# Patient Record
Sex: Male | Born: 1950 | Race: White | Hispanic: No | Marital: Married
Health system: Southern US, Community
[De-identification: ages and names within clinical notes are randomized; demographics above are authoritative.]

## PROBLEM LIST (undated history)

## (undated) DIAGNOSIS — J9621 Acute and chronic respiratory failure with hypoxia: Secondary | ICD-10-CM

## (undated) DIAGNOSIS — G7281 Critical illness myopathy: Secondary | ICD-10-CM

## (undated) DIAGNOSIS — A419 Sepsis, unspecified organism: Secondary | ICD-10-CM

## (undated) DIAGNOSIS — U071 COVID-19: Secondary | ICD-10-CM

## (undated) DIAGNOSIS — J80 Acute respiratory distress syndrome: Secondary | ICD-10-CM

## (undated) DIAGNOSIS — R6520 Severe sepsis without septic shock: Secondary | ICD-10-CM

## (undated) DIAGNOSIS — J1282 Pneumonia due to coronavirus disease 2019: Secondary | ICD-10-CM

---

## 2019-09-13 ENCOUNTER — Inpatient Hospital Stay
Admit: 2019-09-13 | Discharge: 2019-10-11 | Disposition: A | Discharge status: Skilled Nursing Facility | Payer: Medicare Other | Source: Other Acute Inpatient Hospital | Attending: Internal Medicine | Admitting: Internal Medicine

## 2019-09-13 ENCOUNTER — Other Ambulatory Visit (HOSPITAL_COMMUNITY): Payer: Medicare Other

## 2019-09-13 ENCOUNTER — Institutional Professional Consult (permissible substitution) (HOSPITAL_COMMUNITY): Payer: Medicare Other

## 2019-09-13 DIAGNOSIS — Z930 Tracheostomy status: Secondary | ICD-10-CM

## 2019-09-13 DIAGNOSIS — G7281 Critical illness myopathy: Secondary | ICD-10-CM | POA: Diagnosis present

## 2019-09-13 DIAGNOSIS — U071 COVID-19: Secondary | ICD-10-CM | POA: Diagnosis present

## 2019-09-13 DIAGNOSIS — J9621 Acute and chronic respiratory failure with hypoxia: Secondary | ICD-10-CM | POA: Diagnosis present

## 2019-09-13 DIAGNOSIS — A419 Sepsis, unspecified organism: Secondary | ICD-10-CM | POA: Diagnosis present

## 2019-09-13 DIAGNOSIS — Z4659 Encounter for fitting and adjustment of other gastrointestinal appliance and device: Secondary | ICD-10-CM

## 2019-09-13 DIAGNOSIS — J189 Pneumonia, unspecified organism: Secondary | ICD-10-CM

## 2019-09-13 DIAGNOSIS — Z0189 Encounter for other specified special examinations: Secondary | ICD-10-CM

## 2019-09-13 DIAGNOSIS — J80 Acute respiratory distress syndrome: Secondary | ICD-10-CM | POA: Diagnosis present

## 2019-09-13 DIAGNOSIS — J9690 Respiratory failure, unspecified, unspecified whether with hypoxia or hypercapnia: Secondary | ICD-10-CM

## 2019-09-13 HISTORY — DX: Sepsis, unspecified organism: R65.20

## 2019-09-13 HISTORY — DX: Sepsis, unspecified organism: A41.9

## 2019-09-13 HISTORY — DX: COVID-19: U07.1

## 2019-09-13 HISTORY — DX: Acute and chronic respiratory failure with hypoxia: J96.21

## 2019-09-13 HISTORY — DX: Critical illness myopathy: G72.81

## 2019-09-13 HISTORY — DX: Acute respiratory distress syndrome: J80

## 2019-09-13 HISTORY — DX: Pneumonia due to coronavirus disease 2019: J12.82

## 2019-09-13 LAB — BLOOD GAS, ARTERIAL
Acid-Base Excess: 2.6 mmol/L — ABNORMAL HIGH (ref 0.0–2.0)
Bicarbonate: 26.2 mmol/L (ref 20.0–28.0)
FIO2: 50
O2 Saturation: 98.8 %
Patient temperature: 37.3
pCO2 arterial: 38.2 mmHg (ref 32.0–48.0)
pH, Arterial: 7.453 — ABNORMAL HIGH (ref 7.350–7.450)
pO2, Arterial: 130 mmHg — ABNORMAL HIGH (ref 83.0–108.0)

## 2019-09-14 ENCOUNTER — Encounter: Payer: Self-pay | Admitting: Internal Medicine

## 2019-09-14 DIAGNOSIS — J9621 Acute and chronic respiratory failure with hypoxia: Secondary | ICD-10-CM | POA: Diagnosis not present

## 2019-09-14 DIAGNOSIS — A419 Sepsis, unspecified organism: Secondary | ICD-10-CM | POA: Diagnosis not present

## 2019-09-14 DIAGNOSIS — Z930 Tracheostomy status: Secondary | ICD-10-CM

## 2019-09-14 DIAGNOSIS — U071 COVID-19: Secondary | ICD-10-CM | POA: Diagnosis not present

## 2019-09-14 DIAGNOSIS — J80 Acute respiratory distress syndrome: Secondary | ICD-10-CM

## 2019-09-14 DIAGNOSIS — R6520 Severe sepsis without septic shock: Secondary | ICD-10-CM | POA: Diagnosis present

## 2019-09-14 DIAGNOSIS — J1282 Pneumonia due to coronavirus disease 2019: Secondary | ICD-10-CM

## 2019-09-14 DIAGNOSIS — G7281 Critical illness myopathy: Secondary | ICD-10-CM

## 2019-09-14 LAB — COMPREHENSIVE METABOLIC PANEL
ALT: 16 U/L (ref 0–44)
AST: 14 U/L — ABNORMAL LOW (ref 15–41)
Albumin: 2.4 g/dL — ABNORMAL LOW (ref 3.5–5.0)
Alkaline Phosphatase: 59 U/L (ref 38–126)
Anion gap: 8 (ref 5–15)
BUN: 19 mg/dL (ref 8–23)
CO2: 25 mmol/L (ref 22–32)
Calcium: 8.8 mg/dL — ABNORMAL LOW (ref 8.9–10.3)
Chloride: 105 mmol/L (ref 98–111)
Creatinine, Ser: <0.3 mg/dL — ABNORMAL LOW (ref 0.61–1.24)
Glucose, Bld: 163 mg/dL — ABNORMAL HIGH (ref 70–99)
Potassium: 4.4 mmol/L (ref 3.5–5.1)
Sodium: 138 mmol/L (ref 135–145)
Total Bilirubin: 0.6 mg/dL (ref 0.3–1.2)
Total Protein: 6.3 g/dL — ABNORMAL LOW (ref 6.5–8.1)

## 2019-09-14 LAB — CBC WITH DIFFERENTIAL/PLATELET
Abs Immature Granulocytes: 0.25 10*3/uL — ABNORMAL HIGH (ref 0.00–0.07)
Basophils Absolute: 0.1 10*3/uL (ref 0.0–0.1)
Basophils Relative: 1 %
Eosinophils Absolute: 0.3 10*3/uL (ref 0.0–0.5)
Eosinophils Relative: 4 %
HCT: 36 % — ABNORMAL LOW (ref 39.0–52.0)
Hemoglobin: 11.7 g/dL — ABNORMAL LOW (ref 13.0–17.0)
Immature Granulocytes: 3 %
Lymphocytes Relative: 18 %
Lymphs Abs: 1.5 10*3/uL (ref 0.7–4.0)
MCH: 29.5 pg (ref 26.0–34.0)
MCHC: 32.5 g/dL (ref 30.0–36.0)
MCV: 90.9 fL (ref 80.0–100.0)
Monocytes Absolute: 1.1 10*3/uL — ABNORMAL HIGH (ref 0.1–1.0)
Monocytes Relative: 13 %
Neutro Abs: 5 10*3/uL (ref 1.7–7.7)
Neutrophils Relative %: 61 %
Platelets: 297 10*3/uL (ref 150–400)
RBC: 3.96 MIL/uL — ABNORMAL LOW (ref 4.22–5.81)
RDW: 14.3 % (ref 11.5–15.5)
WBC: 8.2 10*3/uL (ref 4.0–10.5)
nRBC: 0 % (ref 0.0–0.2)

## 2019-09-14 LAB — HEMOGLOBIN A1C
Hgb A1c MFr Bld: 8.8 % — ABNORMAL HIGH (ref 4.8–5.6)
Mean Plasma Glucose: 205.86 mg/dL

## 2019-09-14 MED ORDER — POTASSIUM CHLORIDE 20 MEQ/15ML (10%) PO SOLN
20.00 | ORAL | Status: DC
Start: 2019-09-14 — End: 2019-09-14

## 2019-09-14 MED ORDER — ATORVASTATIN CALCIUM 40 MG PO TABS
80.00 | ORAL_TABLET | ORAL | Status: DC
Start: 2019-09-13 — End: 2019-09-14

## 2019-09-14 MED ORDER — FENOFIBRATE 54 MG PO TABS
145.00 | ORAL_TABLET | ORAL | Status: DC
Start: 2019-09-14 — End: 2019-09-14

## 2019-09-14 MED ORDER — ASPIRIN 81 MG PO CHEW
81.00 | CHEWABLE_TABLET | ORAL | Status: DC
Start: 2019-09-14 — End: 2019-09-14

## 2019-09-14 MED ORDER — ENOXAPARIN SODIUM 60 MG/0.6ML ~~LOC~~ SOLN
0.50 | SUBCUTANEOUS | Status: DC
Start: 2019-09-13 — End: 2019-09-14

## 2019-09-14 MED ORDER — IPRATROPIUM-ALBUTEROL 0.5-2.5 (3) MG/3ML IN SOLN
3.00 | RESPIRATORY_TRACT | Status: DC
Start: ? — End: 2019-09-14

## 2019-09-14 MED ORDER — FAMOTIDINE 40 MG/5ML PO SUSR
20.00 | ORAL | Status: DC
Start: 2019-09-13 — End: 2019-09-14

## 2019-09-14 MED ORDER — PRO-STAT PO LIQD
ORAL | Status: DC
Start: 2019-09-13 — End: 2019-09-14

## 2019-09-14 MED ORDER — OXYCODONE HCL 5 MG/5ML PO SOLN
5.00 | ORAL | Status: DC
Start: ? — End: 2019-09-14

## 2019-09-14 MED ORDER — CHLORHEXIDINE GLUCONATE 0.12 % MT SOLN
15.00 | OROMUCOSAL | Status: DC
Start: 2019-09-13 — End: 2019-09-14

## 2019-09-14 MED ORDER — DEXTROSE 10 % IV SOLN
125.00 | INTRAVENOUS | Status: DC
Start: ? — End: 2019-09-14

## 2019-09-14 MED ORDER — THIAMINE HCL 100 MG PO TABS
100.00 | ORAL_TABLET | ORAL | Status: DC
Start: 2019-09-14 — End: 2019-09-14

## 2019-09-14 MED ORDER — GENERIC EXTERNAL MEDICATION
5.00 | Status: DC
Start: ? — End: 2019-09-14

## 2019-09-14 MED ORDER — TRAMADOL HCL 50 MG PO TABS
50.00 | ORAL_TABLET | ORAL | Status: DC
Start: ? — End: 2019-09-14

## 2019-09-14 MED ORDER — INSULIN GLARGINE 100 UNIT/ML SOLOSTAR PEN
30.00 | PEN_INJECTOR | SUBCUTANEOUS | Status: DC
Start: 2019-09-13 — End: 2019-09-14

## 2019-09-14 MED ORDER — GENERIC EXTERNAL MEDICATION
Status: DC
Start: ? — End: 2019-09-14

## 2019-09-14 MED ORDER — CHLORHEXIDINE GLUCONATE 0.12 % MT SOLN
15.00 | OROMUCOSAL | Status: DC
Start: ? — End: 2019-09-14

## 2019-09-14 MED ORDER — RISPERIDONE 0.5 MG PO TBDP
0.50 | ORAL_TABLET | ORAL | Status: DC
Start: 2019-09-13 — End: 2019-09-14

## 2019-09-14 MED ORDER — AMLODIPINE BESYLATE 5 MG PO TABS
5.00 | ORAL_TABLET | ORAL | Status: DC
Start: 2019-09-14 — End: 2019-09-14

## 2019-09-14 MED ORDER — PRAMIPEXOLE DIHYDROCHLORIDE 0.25 MG PO TABS
0.25 | ORAL_TABLET | ORAL | Status: DC
Start: 2019-09-14 — End: 2019-09-14

## 2019-09-14 MED ORDER — HYDRALAZINE HCL 20 MG/ML IJ SOLN
10.00 | INTRAMUSCULAR | Status: DC
Start: ? — End: 2019-09-14

## 2019-09-14 MED ORDER — INSULIN LISPRO 100 UNIT/ML ~~LOC~~ SOLN
2.00 | SUBCUTANEOUS | Status: DC
Start: 2019-09-13 — End: 2019-09-14

## 2019-09-14 MED ORDER — LABETALOL HCL 5 MG/ML IV SOLN
20.00 | INTRAVENOUS | Status: DC
Start: ? — End: 2019-09-14

## 2019-09-14 NOTE — Consult Note (Signed)
Pulmonary Critical Care Medicine [facility] [place]  PULMONARY SERVICE  Date of Service: 09/14/2019  PULMONARY CRITICAL CARE CONSULT   Chad Nash.  BMS:[id number]  DOB: 27-Jun-1951   DOA: 09/13/2019  Referring Physician: Carron Curie, MD  HPI: Chad Nash. is a 69 y.o. male seen for follow up of Acute on Chronic Respiratory Failure.  Patient has multiple medical problems including history of GERD esophageal reflux prostate cancer hyperlipidemia obesity restless leg type 2 diabetes hypertension presented to the hospital came into the hospital because of shortness of breath which was worsening.  Along with this the patient had cough and malaise aches and pains.  Patient did have a x-ray work-up done which showed patchy airspace disease ARDS type picture consistent with COVID-19.  Patient did undergo testing for COVID-19 which was positive had received remdesivir convalescent plasma azithromycin and Rocephin and also Decadron.  Subsequently patient was not able to do much in the way of weaning he did do spontaneous breathing trials on the ventilator after he had been intubated subsequently ended up having to have a tracheostomy as well as a PEG done for his failure to wean.  Transferred here for further management  Review of Systems:  ROS performed and is unremarkable other than noted above.  Past Medical History: Past Medical History:  Diagnosis Date  . Essential hypertension  . GERD (gastroesophageal reflux disease)  . History of prostate cancer  . HLD (hyperlipidemia)  . Obesity  . Osteoarthritis of multiple joints  . Restless leg syndrome  . Type 2 diabetes mellitus (HCC)   Social History: Social History   Socioeconomic History  . Marital status: Married  Spouse name: Not on file  . Number of children: Not on file  . Years of education: Not on file  . Highest education level: Not on file  Occupational History  . Not on file  Social Needs  .  Financial resource strain: Not on file  . Food insecurity  Worry: Not on file  Inability: Not on file  . Transportation needs  Medical: Not on file  Non-medical: Not on file  Tobacco Use  . Smoking status: Former Smoker  Years: 40.00  Types: Cigarettes  Substance and Sexual Activity  . Alcohol use: Never  Frequency: Never  Binge frequency: Never  . Drug use: Never  . Sexual activity: Not on file  Lifestyle  . Physical activity  Days per week: Not on file  Minutes per session: Not on file  . Stress: Not on file  Relationships  . Social Multimedia programmer on phone: Not on file  Gets together: Not on file  Attends religious service: Not on file  Active member of club or organization: Not on file  Attends meetings of clubs or organizations: Not on file  Relationship status: Not on file  Other Topics Concern  . Not on file  Social History Narrative  . Not on file   Family History: History reviewed. No pertinent family history.    Medications: Reviewed on Rounds  Physical Exam:  Vitals: Temperature 97.7 pulse 97 respiratory rate 30 blood pressure is 141/84 saturations 95%  Ventilator Settings mode ventilation assist control FiO2 55% tidal volume 545 PEEP 5  . General: Comfortable at this time . Eyes: Grossly normal lids, irises & conjunctiva . ENT: grossly tongue is normal . Neck: no obvious mass . Cardiovascular: S1-S2 normal no gallop or rub . Respiratory: No rhonchi no rales are noted at this time .  Abdomen: Soft and nontender . Skin: no rash seen on limited exam . Musculoskeletal: not rigid . Psychiatric:unable to assess . Neurologic: no seizure no involuntary movements         Labs on Admission:  Basic Metabolic Panel: Recent Labs  Lab 09/14/19 0554  NA 138  K 4.4  CL 105  CO2 25  GLUCOSE 163*  BUN 19  CREATININE <0.30*  CALCIUM 8.8*    Recent Labs  Lab 09/13/19 1730  PHART 7.453*  PCO2ART 38.2  PO2ART 130*  HCO3 26.2  O2SAT 98.8     Liver Function Tests: Recent Labs  Lab 09/14/19 0554  AST 14*  ALT 16  ALKPHOS 59  BILITOT 0.6  PROT 6.3*  ALBUMIN 2.4*   No results for input(s): LIPASE, AMYLASE in the last 168 hours. No results for input(s): AMMONIA in the last 168 hours.  CBC: Recent Labs  Lab 09/14/19 0554  WBC 8.2  NEUTROABS 5.0  HGB 11.7*  HCT 36.0*  MCV 90.9  PLT 297    Cardiac Enzymes: No results for input(s): CKTOTAL, CKMB, CKMBINDEX, TROPONINI in the last 168 hours.  BNP (last 3 results) No results for input(s): BNP in the last 8760 hours.  ProBNP (last 3 results) No results for input(s): PROBNP in the last 8760 hours.   Radiological Exams on Admission: DG Abd 1 View  Result Date: 09/13/2019 CLINICAL DATA:  NG tube placement EXAM: ABDOMEN - 1 VIEW COMPARISON:  None. FINDINGS: The enteric tube projects over the gastric antrum/proximal duodenum. The bowel gas pattern is nonobstructive and nonspecific. Degenerative changes are noted in the lumbar spine. IMPRESSION: Enteric tube as above. Electronically Signed   By: Katherine Mantle M.D.   On: 09/13/2019 21:24   DG Chest Port 1 View  Result Date: 09/13/2019 CLINICAL DATA:  S/P tracheostomy. Recent COVID-19. EXAM: PORTABLE CHEST 1 VIEW COMPARISON:  None. FINDINGS: Tracheostomy in place. Orogastric tube courses below the diaphragm with tip out of field of view. Normal mediastinal contours. Heart size appears normal. There are coarse reticular opacities with a peripheral and basilar predominance, greatest in the left lung base. No pneumothorax or large pleural effusion. 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 Electronically Signed   By: Emmaline Kluver M.D.   On: 09/13/2019 18:51    Assessment/Plan Active Problems:   Acute on chronic  respiratory failure with hypoxia (HCC)   COVID-19 virus infection   Pneumonia due to COVID-19 virus   Acute respiratory distress syndrome (ARDS) due to 2019 novel coronavirus (HCC)   Severe sepsis (HCC)   Critical illness myopathy   1. Acute on chronic respiratory failure with hypoxia currently patient is on full support has been on assist control mode requiring 55% FiO2 with a PEEP of 5 patient's tidal volumes are about 545 plan is to continue with full support on the ventilator.  Chest x-ray still showing some reticular opacities suggestive of some chronic interstitial lung disease.  We will need to review old films. 2. COVID-19 pneumonia patient has been treated still has significant deficits noted on the chest x-ray which is suggestive of chronic interstitial lung disease need to compare previous films likely as a consequence of COVID-19. 3. COVID-19 virus infection now in resolution phase patient slow to recover we will continue with supportive care. 4. Severe sepsis patient has been treated with antibiotics right now hemodynamics  are stable we will continue to monitor. 5. Critical illness polymyopathy physical therapy as tolerated patient is slow to recover we will continue to monitor closely.  I have personally seen and evaluated the patient, evaluated laboratory and imaging results, formulated the assessment and plan and placed orders. The Patient requires high complexity decision making for assessment and support.  Case was discussed on Rounds with the Respiratory Therapy Staff Time Spent 70minutes  Allyne Gee, MD [facility] Pulmonary Critical Care Medicine Sleep Medicine

## 2019-09-15 DIAGNOSIS — J9621 Acute and chronic respiratory failure with hypoxia: Secondary | ICD-10-CM | POA: Diagnosis not present

## 2019-09-15 DIAGNOSIS — G7281 Critical illness myopathy: Secondary | ICD-10-CM | POA: Diagnosis not present

## 2019-09-15 DIAGNOSIS — A419 Sepsis, unspecified organism: Secondary | ICD-10-CM | POA: Diagnosis not present

## 2019-09-15 DIAGNOSIS — U071 COVID-19: Secondary | ICD-10-CM | POA: Diagnosis not present

## 2019-09-15 NOTE — Progress Notes (Signed)
Pulmonary Critical Care Medicine [place]   PULMONARY CRITICAL CARE SERVICE  PROGRESS NOTE  Date of Service: 09/15/2019  Chad Mt.  HYW:[id number]  DOB: 02-25-1951   DOA: 09/13/2019  Referring Physician: Merton Border, MD  HPI: Chad Lennartz. is a 69 y.o. male seen for follow up of Acute on Chronic Respiratory Failure.  Patient is on full vent support again right now is requiring 45% FiO2.  Patient was weaning on pressure support earlier did about 2.5 hours on pressure support wean which is good progress for first day.  We are going to continue to follow this along  Medications: Reviewed on Rounds  Physical Exam:  Vitals: Temperature 98.4 pulse 107 respiratory rate 31 blood pressure is 135/71 saturations 93%  Ventilator Settings mode ventilation assist control FiO2 45% tidal volume 500 PEEP 5  . General: Comfortable at this time . Eyes: Grossly normal lids, irises & conjunctiva . ENT: grossly tongue is normal . Neck: no obvious mass . Cardiovascular: S1 S2 normal no gallop . Respiratory: No rhonchi coarse breath sounds are noted at this time . Abdomen: soft . Skin: no rash seen on limited exam . Musculoskeletal: not rigid . Psychiatric:unable to assess . Neurologic: no seizure no involuntary movements         Lab Data:   Basic Metabolic Panel: Recent Labs  Lab 09/14/19 0554  NA 138  K 4.4  CL 105  CO2 25  GLUCOSE 163*  BUN 19  CREATININE <0.30*  CALCIUM 8.8*    ABG: Recent Labs  Lab 09/13/19 1730  PHART 7.453*  PCO2ART 38.2  PO2ART 130*  HCO3 26.2  O2SAT 98.8    Liver Function Tests: Recent Labs  Lab 09/14/19 0554  AST 14*  ALT 16  ALKPHOS 59  BILITOT 0.6  PROT 6.3*  ALBUMIN 2.4*   No results for input(s): LIPASE, AMYLASE in the last 168 hours. No results for input(s): AMMONIA in the last 168 hours.  CBC: Recent Labs  Lab 09/14/19 0554  WBC 8.2  NEUTROABS 5.0  HGB 11.7*  HCT 36.0*  MCV 90.9   PLT 297    Cardiac Enzymes: No results for input(s): CKTOTAL, CKMB, CKMBINDEX, TROPONINI in the last 168 hours.  BNP (last 3 results) No results for input(s): BNP in the last 8760 hours.  ProBNP (last 3 results) No results for input(s): PROBNP in the last 8760 hours.  Radiological Exams: DG Abd 1 View  Result Date: 09/13/2019 CLINICAL DATA:  NG tube placement EXAM: ABDOMEN - 1 VIEW COMPARISON:  None. FINDINGS: The enteric tube projects over the gastric antrum/proximal duodenum. The bowel gas pattern is nonobstructive and nonspecific. Degenerative changes are noted in the lumbar spine. IMPRESSION: Enteric tube as above. Electronically Signed   By: Constance Holster M.D.   On: 09/13/2019 21:24   DG Chest Port 1 View  Result Date: 09/13/2019 CLINICAL DATA:  S/P tracheostomy. Recent COVID-19. EXAM: PORTABLE CHEST 1 VIEW COMPARISON:  None. FINDINGS: Tracheostomy in place. Orogastric tube courses below the diaphragm with tip out of field of view. Normal mediastinal contours. Heart size appears normal. There are coarse reticular opacities with a peripheral and basilar predominance, greatest in the left lung base. No pneumothorax or large pleural effusion. 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 Electronically Signed   By: Jac Canavan.D.  On: 09/13/2019 18:51    Assessment/Plan Active Problems:   Acute on chronic respiratory failure with hypoxia (HCC)   COVID-19 virus infection   Pneumonia due to COVID-19 virus   Acute respiratory distress syndrome (ARDS) due to 2019 novel coronavirus (HCC)   Severe sepsis (HCC)   Critical illness myopathy   1. Acute on chronic respiratory failure with hypoxia at this time patient is back on the resting mode on assist control mode did do about  2.5hours pressure support wean plan is to going to be to continue to build on that. 2. COVID-19 virus infection in recovery phase follow-up chest x-ray reveals chronic interstitial changes which is likely consequence of the COVID-19 infection 3. ARDS secondary to COVID-19 patient has residual deficits noted on the x-rays.  Plan is going to be to continue with therapy as tolerated. 4. Severe sepsis right now afebrile hemodynamics are stable we will continue with supportive care. 5. Critical illness myopathy patient still has significant weakness will need ongoing physical therapy as able to tolerate.   I have personally seen and evaluated the patient, evaluated laboratory and imaging results, formulated the assessment and plan and placed orders. The Patient requires high complexity decision making with multiple systems involvement.  Rounds were done with the Respiratory Therapy Director and Staff therapists and discussed with nursing staff also.  Time 35 minutes  Chad Pax, MD [facility] Pulmonary Critical Care Medicine Sleep Medicine

## 2019-09-16 DIAGNOSIS — A419 Sepsis, unspecified organism: Secondary | ICD-10-CM | POA: Diagnosis not present

## 2019-09-16 DIAGNOSIS — J9621 Acute and chronic respiratory failure with hypoxia: Secondary | ICD-10-CM | POA: Diagnosis not present

## 2019-09-16 DIAGNOSIS — U071 COVID-19: Secondary | ICD-10-CM | POA: Diagnosis not present

## 2019-09-16 DIAGNOSIS — G7281 Critical illness myopathy: Secondary | ICD-10-CM | POA: Diagnosis not present

## 2019-09-16 NOTE — Progress Notes (Signed)
Pulmonary Critical Care Medicine [facility] [place]   PULMONARY CRITICAL CARE SERVICE  PROGRESS NOTE  Date of Service: 09/16/2019  [name]  TMH:[id number]  DOB: 09/15/1950   DOA: 09/13/2019  Referring Physician: Carron Curie, MD  HPI: Tarun Patchell. is a 69 y.o. male seen for follow up of Acute on Chronic Respiratory Failure.  Patient was able to do about 4 and half hours of pressure support today actually tolerated it fairly well right now is back on assist control on 45% FiO2  Medications: Reviewed on Rounds  Physical Exam:  Vitals: Temperature 97.9 pulse 87 respiratory 32 blood pressure is 154/88 saturations 96%  Ventilator Settings mode ventilation assist control FiO2 45% tidal volume is 620 PEEP 5  . General: Comfortable at this time . Eyes: Grossly normal lids, irises & conjunctiva . ENT: grossly tongue is normal . Neck: no obvious mass . Cardiovascular: S1 S2 normal no gallop . Respiratory: No rhonchi no rales are noted at this time . Abdomen: soft . Skin: no rash seen on limited exam . Musculoskeletal: not rigid . Psychiatric:unable to assess . Neurologic: no seizure no involuntary movements         Lab Data:   Basic Metabolic Panel: Recent Labs  Lab 09/14/19 0554  NA 138  K 4.4  CL 105  CO2 25  GLUCOSE 163*  BUN 19  CREATININE <0.30*  CALCIUM 8.8*    ABG: Recent Labs  Lab 09/13/19 1730  PHART 7.453*  PCO2ART 38.2  PO2ART 130*  HCO3 26.2  O2SAT 98.8    Liver Function Tests: Recent Labs  Lab 09/14/19 0554  AST 14*  ALT 16  ALKPHOS 59  BILITOT 0.6  PROT 6.3*  ALBUMIN 2.4*   No results for input(s): LIPASE, AMYLASE in the last 168 hours. No results for input(s): AMMONIA in the last 168 hours.  CBC: Recent Labs  Lab 09/14/19 0554  WBC 8.2  NEUTROABS 5.0  HGB 11.7*  HCT 36.0*  MCV 90.9  PLT 297    Cardiac Enzymes: No results for input(s): CKTOTAL, CKMB, CKMBINDEX, TROPONINI in the last 168  hours.  BNP (last 3 results) No results for input(s): BNP in the last 8760 hours.  ProBNP (last 3 results) No results for input(s): PROBNP in the last 8760 hours.  Radiological Exams: No results found.  Assessment/Plan Active Problems:   Acute on chronic respiratory failure with hypoxia (HCC)   COVID-19 virus infection   Pneumonia due to COVID-19 virus   Acute respiratory distress syndrome (ARDS) due to 2019 novel coronavirus (HCC)   Severe sepsis (HCC)   Critical illness myopathy   1. Acute on chronic respiratory failure with hypoxia plan is to continue to wean on pressure support as tolerated patient was able to complete 4.5 hours as indicated 2. COVID-19 virus infection resolved 3. Pneumonia due to COVID-19 improving continue to monitor radiologically 4. ARDS patient has some residual changes of ARDS continue with supportive care 5. Severe sepsis hemodynamics are stable 6. Critical illness myopathy at baseline   I have personally seen and evaluated the patient, evaluated laboratory and imaging results, formulated the assessment and plan and placed orders. The Patient requires high complexity decision making with multiple systems involvement.  Rounds were done with the Respiratory Therapy Director and Staff therapists and discussed with nursing staff also.  [name], MD [facility] Pulmonary Critical Care Medicine Sleep Medicine

## 2019-09-17 DIAGNOSIS — G7281 Critical illness myopathy: Secondary | ICD-10-CM | POA: Diagnosis not present

## 2019-09-17 DIAGNOSIS — J9621 Acute and chronic respiratory failure with hypoxia: Secondary | ICD-10-CM | POA: Diagnosis not present

## 2019-09-17 DIAGNOSIS — U071 COVID-19: Secondary | ICD-10-CM | POA: Diagnosis not present

## 2019-09-17 DIAGNOSIS — A419 Sepsis, unspecified organism: Secondary | ICD-10-CM | POA: Diagnosis not present

## 2019-09-17 NOTE — Progress Notes (Signed)
Pulmonary Critical Care Medicine [facility] [place]   PULMONARY CRITICAL CARE SERVICE  PROGRESS NOTE  Date of Service: 09/17/2019  Chad Nash.  YHC:[id number]  DOB: 12-02-1950   DOA: 09/13/2019  Referring Physician: Carron Curie, MD  HPI: Chad Nash. is a 69 y.o. male seen for follow up of Acute on Chronic Respiratory Failure.  Patient currently is on vent on pressure support mode weaning on protocol the patient is supposed to do 8 hours on pressure support and so far is tolerating it well.  Secretions are reportedly minimal  Medications: Reviewed on Rounds  Physical Exam:  Vitals: Temperature 98.4 pulse 102 respiratory 33 blood pressure is 161/88 saturations 95%  Ventilator Settings mode ventilation pressure support 5 to 45% pressure support 12 PEEP 5 tidal volume is 622  . General: Comfortable at this time . Eyes: Grossly normal lids, irises & conjunctiva . ENT: grossly tongue is normal . Neck: no obvious mass . Cardiovascular: S1 S2 normal no gallop . Respiratory: No rhonchi no rales are noted at this time . Abdomen: soft . Skin: no rash seen on limited exam . Musculoskeletal: not rigid . Psychiatric:unable to assess . Neurologic: no seizure no involuntary movements         Lab Data:   Basic Metabolic Panel: Recent Labs  Lab 09/14/19 0554  NA 138  K 4.4  CL 105  CO2 25  GLUCOSE 163*  BUN 19  CREATININE <0.30*  CALCIUM 8.8*    ABG: Recent Labs  Lab 09/13/19 1730  PHART 7.453*  PCO2ART 38.2  PO2ART 130*  HCO3 26.2  O2SAT 98.8    Liver Function Tests: Recent Labs  Lab 09/14/19 0554  AST 14*  ALT 16  ALKPHOS 59  BILITOT 0.6  PROT 6.3*  ALBUMIN 2.4*   No results for input(s): LIPASE, AMYLASE in the last 168 hours. No results for input(s): AMMONIA in the last 168 hours.  CBC: Recent Labs  Lab 09/14/19 0554  WBC 8.2  NEUTROABS 5.0  HGB 11.7*  HCT 36.0*  MCV 90.9  PLT 297    Cardiac Enzymes: No  results for input(s): CKTOTAL, CKMB, CKMBINDEX, TROPONINI in the last 168 hours.  BNP (last 3 results) No results for input(s): BNP in the last 8760 hours.  ProBNP (last 3 results) No results for input(s): PROBNP in the last 8760 hours.  Radiological Exams: No results found.  Assessment/Plan Active Problems:   Acute on chronic respiratory failure with hypoxia (HCC)   COVID-19 virus infection   Pneumonia due to COVID-19 virus   Acute respiratory distress syndrome (ARDS) due to 2019 novel coronavirus (HCC)   Severe sepsis (HCC)   Critical illness myopathy   1. Acute on chronic respiratory failure hypoxia plan is to continue with the weaning protocol currently is on 12/5 pressure support wean and is tolerating well the goal is for 8 hours today 2. COVID-19 virus infection in resolution phase 3. Pneumonia due to COVID-19 treated resolved 4. ARDS treated we will continue with supportive care 5. Severe sepsis hemodynamics are stable 6. Critical illness myopathy continue with physical therapy supportive care at this time   I have personally seen and evaluated the patient, evaluated laboratory and imaging results, formulated the assessment and plan and placed orders. The Patient requires high complexity decision making with multiple systems involvement.  Rounds were done with the Respiratory Therapy Director and Staff therapists and discussed with nursing staff also.  [name], MD [facility] Pulmonary Critical  Care Medicine Sleep Medicine

## 2019-09-18 DIAGNOSIS — G7281 Critical illness myopathy: Secondary | ICD-10-CM | POA: Diagnosis not present

## 2019-09-18 DIAGNOSIS — U071 COVID-19: Secondary | ICD-10-CM | POA: Diagnosis not present

## 2019-09-18 DIAGNOSIS — A419 Sepsis, unspecified organism: Secondary | ICD-10-CM | POA: Diagnosis not present

## 2019-09-18 DIAGNOSIS — J9621 Acute and chronic respiratory failure with hypoxia: Secondary | ICD-10-CM | POA: Diagnosis not present

## 2019-09-18 LAB — BASIC METABOLIC PANEL
Anion gap: 11 (ref 5–15)
BUN: 22 mg/dL (ref 8–23)
CO2: 26 mmol/L (ref 22–32)
Calcium: 9.1 mg/dL (ref 8.9–10.3)
Chloride: 103 mmol/L (ref 98–111)
Creatinine, Ser: <0.3 mg/dL — ABNORMAL LOW (ref 0.61–1.24)
Glucose, Bld: 146 mg/dL — ABNORMAL HIGH (ref 70–99)
Potassium: 3.8 mmol/L (ref 3.5–5.1)
Sodium: 140 mmol/L (ref 135–145)

## 2019-09-18 LAB — CBC
HCT: 32.8 % — ABNORMAL LOW (ref 39.0–52.0)
Hemoglobin: 10.8 g/dL — ABNORMAL LOW (ref 13.0–17.0)
MCH: 29.8 pg (ref 26.0–34.0)
MCHC: 32.9 g/dL (ref 30.0–36.0)
MCV: 90.6 fL (ref 80.0–100.0)
Platelets: 396 10*3/uL (ref 150–400)
RBC: 3.62 MIL/uL — ABNORMAL LOW (ref 4.22–5.81)
RDW: 14.2 % (ref 11.5–15.5)
WBC: 12.3 10*3/uL — ABNORMAL HIGH (ref 4.0–10.5)
nRBC: 0 % (ref 0.0–0.2)

## 2019-09-18 NOTE — Progress Notes (Signed)
Pulmonary Critical Care Medicine [facility] [place]   PULMONARY CRITICAL CARE SERVICE  PROGRESS NOTE  Date of Service: 09/18/2019  Chad Nash.  AST:[id number]  DOB: June 09, 1951   DOA: 09/13/2019  Referring Physician: Carron Curie, MD  HPI: Chad Nash. is a 69 y.o. male seen for follow up of Acute on Chronic Respiratory Failure.  Patient currently is on pressure support wean seems to be tolerating it well the goal is for about 12 hours today which is coming along nicely so far  Medications: Reviewed on Rounds  Physical Exam:  Vitals: Temperature 98.1 pulse 95 respiratory rate 30 blood pressure is 173/71 saturations 95%  Ventilator Settings mode of ventilation is pressure support FiO2 45% pressure 12 PEEP 5  . General: Comfortable at this time . Eyes: Grossly normal lids, irises & conjunctiva . ENT: grossly tongue is normal . Neck: no obvious mass . Cardiovascular: S1 S2 normal no gallop . Respiratory: No rhonchi no rales are noted at this time . Abdomen: soft . Skin: no rash seen on limited exam . Musculoskeletal: not rigid . Psychiatric:unable to assess . Neurologic: no seizure no involuntary movements         Lab Data:   Basic Metabolic Panel: Recent Labs  Lab 09/14/19 0554 09/18/19 0600  NA 138 140  K 4.4 3.8  CL 105 103  CO2 25 26  GLUCOSE 163* 146*  BUN 19 22  CREATININE <0.30* <0.30*  CALCIUM 8.8* 9.1    ABG: Recent Labs  Lab 09/13/19 1730  PHART 7.453*  PCO2ART 38.2  PO2ART 130*  HCO3 26.2  O2SAT 98.8    Liver Function Tests: Recent Labs  Lab 09/14/19 0554  AST 14*  ALT 16  ALKPHOS 59  BILITOT 0.6  PROT 6.3*  ALBUMIN 2.4*   No results for input(s): LIPASE, AMYLASE in the last 168 hours. No results for input(s): AMMONIA in the last 168 hours.  CBC: Recent Labs  Lab 09/14/19 0554 09/18/19 0600  WBC 8.2 12.3*  NEUTROABS 5.0  --   HGB 11.7* 10.8*  HCT 36.0* 32.8*  MCV 90.9 90.6  PLT 297 396     Cardiac Enzymes: No results for input(s): CKTOTAL, CKMB, CKMBINDEX, TROPONINI in the last 168 hours.  BNP (last 3 results) No results for input(s): BNP in the last 8760 hours.  ProBNP (last 3 results) No results for input(s): PROBNP in the last 8760 hours.  Radiological Exams: No results found.  Assessment/Plan Active Problems:   Acute on chronic respiratory failure with hypoxia (HCC)   COVID-19 virus infection   Pneumonia due to COVID-19 virus   Acute respiratory distress syndrome (ARDS) due to 2019 novel coronavirus (HCC)   Severe sepsis (HCC)   Critical illness myopathy   1. Acute on chronic respiratory failure with hypoxia we will continue with weaning on pressure support goal 12/5 for 12 hours 2. COVID-19 virus infection treated clinically is improving we will continue with supportive care 3. Pneumonia due to COVID-19 treated clinically improved 4. ARDS slow to resolve secondary to COVID-19 will continue to follow 5. Severe sepsis resolved hemodynamics are stable 6. Critical illness myopathy physical therapy as tolerated we will continue to monitor closely.   I have personally seen and evaluated the patient, evaluated laboratory and imaging results, formulated the assessment and plan and placed orders. The Patient requires high complexity decision making with multiple systems involvement.  Rounds were done with the Respiratory Therapy Director and Staff therapists and discussed with  nursing staff also.  Allyne Gee, MD [facility] Pulmonary Critical Care Medicine Sleep Medicine

## 2019-09-19 DIAGNOSIS — J9621 Acute and chronic respiratory failure with hypoxia: Secondary | ICD-10-CM | POA: Diagnosis not present

## 2019-09-19 DIAGNOSIS — U071 COVID-19: Secondary | ICD-10-CM | POA: Diagnosis not present

## 2019-09-19 DIAGNOSIS — G7281 Critical illness myopathy: Secondary | ICD-10-CM | POA: Diagnosis not present

## 2019-09-19 DIAGNOSIS — A419 Sepsis, unspecified organism: Secondary | ICD-10-CM | POA: Diagnosis not present

## 2019-09-19 NOTE — Progress Notes (Signed)
Pulmonary Critical Care Medicine [facility] [place]   PULMONARY CRITICAL CARE SERVICE  PROGRESS NOTE  Date of Service: 09/19/2019  [name]  CVE:[id number]  DOB: [birth year]   DOA: 09/13/2019  Referring Physician: Carron Curie, MD  HPI: Zaydrian Batta. is a 69 y.o. male seen for follow up of Acute on Chronic Respiratory Failure.  The patient this morning apparently had increased respiratory rate noted.  Patient has been on 40% FiO2 respiratory rate had gone up to 40 was given some Ativan and actually the respiratory rate has now come down to about 28.  Patient saturations are good the respiratory therapist is going to assess the potential for resuming weaning on the patient later this morning  Medications: Reviewed on Rounds  Physical Exam:  Vitals: Temperature 96.3 pulse 109 respiratory rate 40 blood pressure 148/73 saturations 95%  Ventilator Settings mode ventilation assist control FiO2 40% tidal line 375 PEEP 5  . General: Comfortable at this time . Eyes: Grossly normal lids, irises & conjunctiva . ENT: grossly tongue is normal . Neck: no obvious mass . Cardiovascular: S1 S2 normal no gallop . Respiratory: No rhonchi coarse breath sounds are noted at this time . Abdomen: soft . Skin: no rash seen on limited exam . Musculoskeletal: not rigid . Psychiatric:unable to assess . Neurologic: no seizure no involuntary movements         Lab Data:   Basic Metabolic Panel: Recent Labs  Lab 09/14/19 0554 09/18/19 0600  NA 138 140  K 4.4 3.8  CL 105 103  CO2 25 26  GLUCOSE 163* 146*  BUN 19 22  CREATININE <0.30* <0.30*  CALCIUM 8.8* 9.1    ABG: Recent Labs  Lab 09/13/19 1730  PHART 7.453*  PCO2ART 38.2  PO2ART 130*  HCO3 26.2  O2SAT 98.8    Liver Function Tests: Recent Labs  Lab 09/14/19 0554  AST 14*  ALT 16  ALKPHOS 59  BILITOT 0.6  PROT 6.3*  ALBUMIN 2.4*   No results for input(s): LIPASE, AMYLASE in the last 168  hours. No results for input(s): AMMONIA in the last 168 hours.  CBC: Recent Labs  Lab 09/14/19 0554 09/18/19 0600  WBC 8.2 12.3*  NEUTROABS 5.0  --   HGB 11.7* 10.8*  HCT 36.0* 32.8*  MCV 90.9 90.6  PLT 297 396    Cardiac Enzymes: No results for input(s): CKTOTAL, CKMB, CKMBINDEX, TROPONINI in the last 168 hours.  BNP (last 3 results) No results for input(s): BNP in the last 8760 hours.  ProBNP (last 3 results) No results for input(s): PROBNP in the last 8760 hours.  Radiological Exams: No results found.  Assessment/Plan Active Problems:   Acute on chronic respiratory failure with hypoxia (HCC)   COVID-19 virus infection   Pneumonia due to COVID-19 virus   Acute respiratory distress syndrome (ARDS) due to 2019 novel coronavirus (HCC)   Severe sepsis (HCC)   Critical illness myopathy   1. Acute on chronic respiratory failure with hypoxia plan is to continue with full support on the ventilator for now respiratory therapist will reassess again this afternoon and try to resume the weaning if patient's respiratory rate is continues to stay down. 2. COVID-19 virus infection and now resolution phase 3. Pneumonia due to COVID-19 treated we will continue to monitor 4. ARDS slowly improving secondary to COVID-19 will continue with supportive care 5. Severe sepsis hemodynamics are stable at this time 6. Critical illness myopathy physical therapy as tolerated  I have personally seen and evaluated the patient, evaluated laboratory and imaging results, formulated the assessment and plan and placed orders. The Patient requires high complexity decision making with multiple systems involvement.  Rounds were done with the Respiratory Therapy Director and Staff therapists and discussed with nursing staff also.  Time 35 minutes  Allyne Gee, MD [facility] Pulmonary Critical Care Medicine Sleep Medicine

## 2019-09-20 DIAGNOSIS — G7281 Critical illness myopathy: Secondary | ICD-10-CM | POA: Diagnosis not present

## 2019-09-20 DIAGNOSIS — J9621 Acute and chronic respiratory failure with hypoxia: Secondary | ICD-10-CM | POA: Diagnosis not present

## 2019-09-20 DIAGNOSIS — A419 Sepsis, unspecified organism: Secondary | ICD-10-CM | POA: Diagnosis not present

## 2019-09-20 DIAGNOSIS — U071 COVID-19: Secondary | ICD-10-CM | POA: Diagnosis not present

## 2019-09-20 LAB — BASIC METABOLIC PANEL
Anion gap: 11 (ref 5–15)
BUN: 27 mg/dL — ABNORMAL HIGH (ref 8–23)
CO2: 29 mmol/L (ref 22–32)
Calcium: 9.2 mg/dL (ref 8.9–10.3)
Chloride: 102 mmol/L (ref 98–111)
Creatinine, Ser: 0.4 mg/dL — ABNORMAL LOW (ref 0.61–1.24)
GFR calc Af Amer: >60 mL/min (ref >60)
GFR calc non Af Amer: >60 mL/min (ref >60)
Glucose, Bld: 121 mg/dL — ABNORMAL HIGH (ref 70–99)
Potassium: 3.5 mmol/L (ref 3.5–5.1)
Sodium: 142 mmol/L (ref 135–145)

## 2019-09-20 LAB — CBC
HCT: 34.7 % — ABNORMAL LOW (ref 39.0–52.0)
Hemoglobin: 11.2 g/dL — ABNORMAL LOW (ref 13.0–17.0)
MCH: 29.6 pg (ref 26.0–34.0)
MCHC: 32.3 g/dL (ref 30.0–36.0)
MCV: 91.8 fL (ref 80.0–100.0)
Platelets: 362 10*3/uL (ref 150–400)
RBC: 3.78 MIL/uL — ABNORMAL LOW (ref 4.22–5.81)
RDW: 14.3 % (ref 11.5–15.5)
WBC: 10 10*3/uL (ref 4.0–10.5)
nRBC: 0 % (ref 0.0–0.2)

## 2019-09-20 NOTE — Progress Notes (Signed)
Pulmonary Critical Care Medicine [facility] [place]   PULMONARY CRITICAL CARE SERVICE  PROGRESS NOTE  Date of Service: 09/20/2019  Chad Nash.  [id number]:[id number]  DOB: [birth year]   DOA: 09/13/2019  Referring Physician: Carron Curie, MD  HPI: Chad Nash. is a 69 y.o. male seen for follow up of Acute on Chronic Respiratory Failure.  Patient has been gradually weaning on pressure support mode right now is on 35% FiO2 good saturations are noted currently was on a pressure of 12/5 patient was able to complete 16 hours yesterday on pressure support so he may be a candidate to try to advance to T collar  Medications: Reviewed on Rounds  Physical Exam:  Vitals: Temperature 97.6 pulse 86 respiratory rate 30 blood pressure is 136/76 saturations 98%  Ventilator Settings mode of ventilation pressure support FiO2 35% pressure poor 12 PEEP 5  . General: Comfortable at this time . Eyes: Grossly normal lids, irises & conjunctiva . ENT: grossly tongue is normal . Neck: no obvious mass . Cardiovascular: S1 S2 normal no gallop . Respiratory: Scattered rhonchi expansion is equal at this time . Abdomen: soft . Skin: no rash seen on limited exam . Musculoskeletal: not rigid . Psychiatric:unable to assess . Neurologic: no seizure no involuntary movements         Lab Data:   Basic Metabolic Panel: Recent Labs  Lab 09/14/19 0554 09/18/19 0600 09/20/19 0534  NA 138 140 142  K 4.4 3.8 3.5  CL 105 103 102  CO2 25 26 29   GLUCOSE 163* 146* 121*  BUN 19 22 27*  CREATININE <0.30* <0.30* 0.40*  CALCIUM 8.8* 9.1 9.2    ABG: Recent Labs  Lab 09/13/19 1730  PHART 7.453*  PCO2ART 38.2  PO2ART 130*  HCO3 26.2  O2SAT 98.8    Liver Function Tests: Recent Labs  Lab 09/14/19 0554  AST 14*  ALT 16  ALKPHOS 59  BILITOT 0.6  PROT 6.3*  ALBUMIN 2.4*   No results for input(s): LIPASE, AMYLASE in the last 168 hours. No results for input(s): AMMONIA in the  last 168 hours.  CBC: Recent Labs  Lab 09/14/19 0554 09/18/19 0600 09/20/19 0534  WBC 8.2 12.3* 10.0  NEUTROABS 5.0  --   --   HGB 11.7* 10.8* 11.2*  HCT 36.0* 32.8* 34.7*  MCV 90.9 90.6 91.8  PLT 297 396 362    Cardiac Enzymes: No results for input(s): CKTOTAL, CKMB, CKMBINDEX, TROPONINI in the last 168 hours.  BNP (last 3 results) No results for input(s): BNP in the last 8760 hours.  ProBNP (last 3 results) No results for input(s): PROBNP in the last 8760 hours.  Radiological Exams: No results found.  Assessment/Plan Active Problems:   Acute on chronic respiratory failure with hypoxia (HCC)   COVID-19 virus infection   Pneumonia due to COVID-19 virus   Acute respiratory distress syndrome (ARDS) due to 2019 novel coronavirus (HCC)   Severe sepsis (HCC)   Critical illness myopathy   1. Acute on chronic respiratory failure with hypoxia plan is to continue with the wean protocol patient was able to complete 16 hours yesterday didn't really have much in the way of distress had some increase in respiratory rate however does seem to respond nicely to Ativan.  This morning patient's rate was around 30 appears to more calm so we will try to do T collar weaning according to protocol however will need to be monitored closely. 2. COVID-19 virus infection this has  resolved and is in resolution phase 3. Pneumonia due to COVID-19 still with significant residual deficits noted on the chest film. 4. ARDS slow improvement oxygen requirements are improving right now is on 35% FiO2 5. Severe sepsis hemodynamics are stable we'll continue with present management 6. Critical illness myopathy physical therapy as tolerated this may very well be the limiting factor as far as being able to wean however so far looks good   I have personally seen and evaluated the patient, evaluated laboratory and imaging results, formulated the assessment and plan and placed orders. The Patient requires high  complexity decision making with multiple systems involvement.  Time 35 minutes Rounds were done with the Respiratory Therapy Director and Staff therapists and discussed with nursing staff also.  Allyne Gee, MD [facility] Pulmonary Critical Care Medicine Sleep Medicine

## 2019-09-21 DIAGNOSIS — A419 Sepsis, unspecified organism: Secondary | ICD-10-CM | POA: Diagnosis not present

## 2019-09-21 DIAGNOSIS — U071 COVID-19: Secondary | ICD-10-CM | POA: Diagnosis not present

## 2019-09-21 DIAGNOSIS — J9621 Acute and chronic respiratory failure with hypoxia: Secondary | ICD-10-CM | POA: Diagnosis not present

## 2019-09-21 DIAGNOSIS — G7281 Critical illness myopathy: Secondary | ICD-10-CM | POA: Diagnosis not present

## 2019-09-21 LAB — BASIC METABOLIC PANEL
Anion gap: 7 (ref 5–15)
BUN: 25 mg/dL — ABNORMAL HIGH (ref 8–23)
CO2: 31 mmol/L (ref 22–32)
Calcium: 8.7 mg/dL — ABNORMAL LOW (ref 8.9–10.3)
Chloride: 102 mmol/L (ref 98–111)
Creatinine, Ser: 0.38 mg/dL — ABNORMAL LOW (ref 0.61–1.24)
GFR calc Af Amer: >60 mL/min (ref >60)
GFR calc non Af Amer: >60 mL/min (ref >60)
Glucose, Bld: 114 mg/dL — ABNORMAL HIGH (ref 70–99)
Potassium: 3.6 mmol/L (ref 3.5–5.1)
Sodium: 140 mmol/L (ref 135–145)

## 2019-09-21 LAB — CBC
HCT: 32.2 % — ABNORMAL LOW (ref 39.0–52.0)
Hemoglobin: 10.3 g/dL — ABNORMAL LOW (ref 13.0–17.0)
MCH: 29.4 pg (ref 26.0–34.0)
MCHC: 32 g/dL (ref 30.0–36.0)
MCV: 92 fL (ref 80.0–100.0)
Platelets: 326 10*3/uL (ref 150–400)
RBC: 3.5 MIL/uL — ABNORMAL LOW (ref 4.22–5.81)
RDW: 14.4 % (ref 11.5–15.5)
WBC: 7.8 10*3/uL (ref 4.0–10.5)
nRBC: 0 % (ref 0.0–0.2)

## 2019-09-21 NOTE — Progress Notes (Signed)
Pulmonary Critical Care Medicine [facility] [place]   PULMONARY CRITICAL CARE SERVICE  PROGRESS NOTE  Date of Service: 09/21/2019  Chad Nash.  QAS:[id number]  DOB: Nov 06, 1950   DOA: 09/13/2019  Referring Physician: Carron Curie, MD  HPI: Chad Nash. is a 69 y.o. male seen for follow up of Acute on Chronic Respiratory Failure.  Patient currently is on T collar has been on 35% FiO2 the goal is for 8 hours  Medications: Reviewed on Rounds  Physical Exam:  Vitals: Temperature 97.3 pulse 80 respiratory 26 blood pressure is 122/77 saturations 98%  Ventilator Settings on T collar currently on 35% FiO2 with a goal of 8 hours  . General: Comfortable at this time . Eyes: Grossly normal lids, irises & conjunctiva . ENT: grossly tongue is normal . Neck: no obvious mass . Cardiovascular: S1 S2 normal no gallop . Respiratory: No rhonchi no rales are noted at this time . Abdomen: soft . Skin: no rash seen on limited exam . Musculoskeletal: not rigid . Psychiatric:unable to assess . Neurologic: no seizure no involuntary movements         Lab Data:   Basic Metabolic Panel: Recent Labs  Lab 09/18/19 0600 09/20/19 0534 09/21/19 0545  NA 140 142 140  K 3.8 3.5 3.6  CL 103 102 102  CO2 26 29 31   GLUCOSE 146* 121* 114*  BUN 22 27* 25*  CREATININE <0.30* 0.40* 0.38*  CALCIUM 9.1 9.2 8.7*    ABG: No results for input(s): PHART, PCO2ART, PO2ART, HCO3, O2SAT in the last 168 hours.  Liver Function Tests: No results for input(s): AST, ALT, ALKPHOS, BILITOT, PROT, ALBUMIN in the last 168 hours. No results for input(s): LIPASE, AMYLASE in the last 168 hours. No results for input(s): AMMONIA in the last 168 hours.  CBC: Recent Labs  Lab 09/18/19 0600 09/20/19 0534 09/21/19 0545  WBC 12.3* 10.0 7.8  HGB 10.8* 11.2* 10.3*  HCT 32.8* 34.7* 32.2*  MCV 90.6 91.8 92.0  PLT 396 362 326    Cardiac Enzymes: No results for input(s): CKTOTAL, CKMB,  CKMBINDEX, TROPONINI in the last 168 hours.  BNP (last 3 results) No results for input(s): BNP in the last 8760 hours.  ProBNP (last 3 results) No results for input(s): PROBNP in the last 8760 hours.  Radiological Exams: No results found.  Assessment/Plan Active Problems:   Acute on chronic respiratory failure with hypoxia (HCC)   COVID-19 virus infection   Pneumonia due to COVID-19 virus   Acute respiratory distress syndrome (ARDS) due to 2019 novel coronavirus (HCC)   Severe sepsis (HCC)   Critical illness myopathy   1. Acute on chronic respiratory failure hypoxia plan continue with T collar trials 8-hour goal patient is doing well so far 2. COVID-19 virus infection treated continue with present management 3. Pneumonia due to COVID-19 continue to monitor closely slow to improve 4. ARDS treated continue with supportive care slow to improve 5. Severe sepsis resolved hemodynamics are stable 6. Critical illness myopathy slowly improving we will continue to follow   I have personally seen and evaluated the patient, evaluated laboratory and imaging results, formulated the assessment and plan and placed orders. The Patient requires high complexity decision making with multiple systems involvement.  Rounds were done with the Respiratory Therapy Director and Staff therapists and discussed with nursing staff also.  2020, MD [facility] Pulmonary Critical Care Medicine Sleep Medicine

## 2019-09-22 ENCOUNTER — Other Ambulatory Visit (HOSPITAL_COMMUNITY): Payer: Medicare Other

## 2019-09-22 DIAGNOSIS — U071 COVID-19: Secondary | ICD-10-CM | POA: Diagnosis not present

## 2019-09-22 DIAGNOSIS — G7281 Critical illness myopathy: Secondary | ICD-10-CM | POA: Diagnosis not present

## 2019-09-22 DIAGNOSIS — A419 Sepsis, unspecified organism: Secondary | ICD-10-CM | POA: Diagnosis not present

## 2019-09-22 DIAGNOSIS — J9621 Acute and chronic respiratory failure with hypoxia: Secondary | ICD-10-CM | POA: Diagnosis not present

## 2019-09-22 MED ORDER — GENERIC EXTERNAL MEDICATION
Status: DC
Start: ? — End: 2019-09-22

## 2019-09-22 NOTE — Progress Notes (Addendum)
Pulmonary Critical Care Medicine [facility] [place]   PULMONARY CRITICAL CARE SERVICE  PROGRESS NOTE  Date of Service: 09/22/2019  Chad Nash.  [id number]  DOB: [birth year]   DOA: 09/13/2019  Referring Physician: Carron Curie, MD  HPI: Chad Nash. is a 69 y.o. male seen for follow up of Acute on Chronic Respiratory Failure.  Patient remains on NAG 4 L/min for 12-hour goal today.  Medications: Reviewed on Rounds  Physical Exam:  Vitals: Pulse 87 respirations 29 BP 150/87 O2 sat 98% temp 97.8  Ventilator Settings no rales or rhonchi noted  . General: Comfortable at this time . Eyes: Grossly normal lids, irises & conjunctiva . ENT: grossly tongue is normal . Neck: no obvious mass . Cardiovascular: S1 S2 normal no gallop . Respiratory: Coarse breath sounds . Abdomen: soft . Skin: no rash seen on limited exam . Musculoskeletal: not rigid . Psychiatric:unable to assess . Neurologic: no seizure no involuntary movements         Lab Data:   Basic Metabolic Panel: Recent Labs  Lab 09/18/19 0600 09/20/19 0534 09/21/19 0545  NA 140 142 140  K 3.8 3.5 3.6  CL 103 102 102  CO2 26 29 31   GLUCOSE 146* 121* 114*  BUN 22 27* 25*  CREATININE <0.30* 0.40* 0.38*  CALCIUM 9.1 9.2 8.7*    ABG: No results for input(s): PHART, PCO2ART, PO2ART, HCO3, O2SAT in the last 168 hours.  Liver Function Tests: No results for input(s): AST, ALT, ALKPHOS, BILITOT, PROT, ALBUMIN in the last 168 hours. No results for input(s): LIPASE, AMYLASE in the last 168 hours. No results for input(s): AMMONIA in the last 168 hours.  CBC: Recent Labs  Lab 09/18/19 0600 09/20/19 0534 09/21/19 0545  WBC 12.3* 10.0 7.8  HGB 10.8* 11.2* 10.3*  HCT 32.8* 34.7* 32.2*  MCV 90.6 91.8 92.0  PLT 396 362 326    Cardiac Enzymes: No results for input(s): CKTOTAL, CKMB, CKMBINDEX, TROPONINI in the last 168 hours.  BNP (last 3 results) No results for input(s): BNP in  the last 8760 hours.  ProBNP (last 3 results) No results for input(s): PROBNP in the last 8760 hours.  Radiological Exams: DG CHEST PORT 1 VIEW  Result Date: 09/22/2019 CLINICAL DATA:  Respiratory failure EXAM: PORTABLE CHEST 1 VIEW COMPARISON:  September 13, 2019. FINDINGS: Tracheostomy catheter tip is 4.1 cm above the carina. Feeding tube tip is below the diaphragm. No pneumothorax. There is extensive coarse interstitial thickening and patchy airspace opacity throughout the lungs bilaterally, similar to prior study. No new opacity is appreciable allowing for slight differences in technique. There is mild cardiac enlargement with pulmonary vascularity normal. No adenopathy. There is aortic atherosclerosis. No evident adenopathy. No bone lesions. IMPRESSION: Multifocal interstitial and patchy airspace opacity, similar to prior study. Suspect atypical organism pneumonia. There may well be a degree of chronic interstitial disease; no earlier examinations beyond 10 days prior to current examination available. Stable cardiac silhouette. Tube positions as described without pneumothorax. Electronically Signed   By: September 15, 2019 III M.D.   On: 09/22/2019 08:52    Assessment/Plan Active Problems:   Acute on chronic respiratory failure with hypoxia (HCC)   COVID-19 virus infection   Pneumonia due to COVID-19 virus   Acute respiratory distress syndrome (ARDS) due to 2019 novel coronavirus (HCC)   Severe sepsis (HCC)   Critical illness myopathy   1. Acute on chronic respiratory failure hypoxia patient has a 12-hour goal today on 4  L NAG.  Will continue aggressive pulmonary toilet and supportive measures. 2. COVID-19 virus infection treated continue with present management 3. Pneumonia due to COVID-19 continue to monitor closely slow to improve 4. ARDS treated continue with supportive care slow to improve 5. Severe sepsis resolved hemodynamics are stable 6. Critical illness myopathy slowly improving  we will continue to follow   I have personally seen and evaluated the patient, evaluated laboratory and imaging results, formulated the assessment and plan and placed orders. The Patient requires high complexity decision making with multiple systems involvement.  Rounds were done with the Respiratory Therapy Director and Staff therapists and discussed with nursing staff also.  Allyne Gee, MD [facility] Pulmonary Critical Care Medicine Sleep Medicine

## 2019-09-23 DIAGNOSIS — G7281 Critical illness myopathy: Secondary | ICD-10-CM | POA: Diagnosis not present

## 2019-09-23 DIAGNOSIS — U071 COVID-19: Secondary | ICD-10-CM | POA: Diagnosis not present

## 2019-09-23 DIAGNOSIS — A419 Sepsis, unspecified organism: Secondary | ICD-10-CM | POA: Diagnosis not present

## 2019-09-23 DIAGNOSIS — J9621 Acute and chronic respiratory failure with hypoxia: Secondary | ICD-10-CM | POA: Diagnosis not present

## 2019-09-23 NOTE — Progress Notes (Signed)
Pulmonary Critical Care Medicine [place]   PULMONARY CRITICAL CARE SERVICE  PROGRESS NOTE  Date of Service: 09/23/2019  Chad Mt.  VQM:[id number]  DOB: 03/04/1951   DOA: 09/13/2019  Referring Physician: Merton Border, MD  HPI: Chad Bigley. is a 69 y.o. male seen for follow up of Acute on Chronic Respiratory Failure.  Patient currently is off the ventilator on NAG patient is supposed to do 16-hour goal today.  Medications: Reviewed on Rounds  Physical Exam:  Vitals: Temperature 96.8 pulse 86 respiratory rate 23 blood pressure is 145/81 saturations 98%  Ventilator Settings off the ventilator on NAG  . General: Comfortable at this time . Eyes: Grossly normal lids, irises & conjunctiva . ENT: grossly tongue is normal . Neck: no obvious mass . Cardiovascular: S1 S2 normal no gallop . Respiratory: No rhonchi coarse breath sounds . Abdomen: soft . Skin: no rash seen on limited exam . Musculoskeletal: not rigid . Psychiatric:unable to assess . Neurologic: no seizure no involuntary movements         Lab Data:   Basic Metabolic Panel: Recent Labs  Lab 09/18/19 0600 09/20/19 0534 09/21/19 0545  NA 140 142 140  K 3.8 3.5 3.6  CL 103 102 102  CO2 26 29 31   GLUCOSE 146* 121* 114*  BUN 22 27* 25*  CREATININE <0.30* 0.40* 0.38*  CALCIUM 9.1 9.2 8.7*    ABG: No results for input(s): PHART, PCO2ART, PO2ART, HCO3, O2SAT in the last 168 hours.  Liver Function Tests: No results for input(s): AST, ALT, ALKPHOS, BILITOT, PROT, ALBUMIN in the last 168 hours. No results for input(s): LIPASE, AMYLASE in the last 168 hours. No results for input(s): AMMONIA in the last 168 hours.  CBC: Recent Labs  Lab 09/18/19 0600 09/20/19 0534 09/21/19 0545  WBC 12.3* 10.0 7.8  HGB 10.8* 11.2* 10.3*  HCT 32.8* 34.7* 32.2*  MCV 90.6 91.8 92.0  PLT 396 362 326    Cardiac Enzymes: No results for input(s): CKTOTAL, CKMB, CKMBINDEX, TROPONINI  in the last 168 hours.  BNP (last 3 results) No results for input(s): BNP in the last 8760 hours.  ProBNP (last 3 results) No results for input(s): PROBNP in the last 8760 hours.  Radiological Exams: DG CHEST PORT 1 VIEW  Result Date: 09/22/2019 CLINICAL DATA:  Respiratory failure EXAM: PORTABLE CHEST 1 VIEW COMPARISON:  September 13, 2019. FINDINGS: Tracheostomy catheter tip is 4.1 cm above the carina. Feeding tube tip is below the diaphragm. No pneumothorax. There is extensive coarse interstitial thickening and patchy airspace opacity throughout the lungs bilaterally, similar to prior study. No new opacity is appreciable allowing for slight differences in technique. There is mild cardiac enlargement with pulmonary vascularity normal. No adenopathy. There is aortic atherosclerosis. No evident adenopathy. No bone lesions. IMPRESSION: Multifocal interstitial and patchy airspace opacity, similar to prior study. Suspect atypical organism pneumonia. There may well be a degree of chronic interstitial disease; no earlier examinations beyond 10 days prior to current examination available. Stable cardiac silhouette. Tube positions as described without pneumothorax. Electronically Signed   By: Lowella Grip III M.D.   On: 09/22/2019 08:52    Assessment/Plan Active Problems:   Acute on chronic respiratory failure with hypoxia (HCC)   COVID-19 virus infection   Pneumonia due to COVID-19 virus   Acute respiratory distress syndrome (ARDS) due to 2019 novel coronavirus (HCC)   Severe sepsis (HCC)   Critical illness myopathy   1. Acute on chronic respiratory  failure with hypoxia plan is to continue to wean off the ventilator goal of 16 hours today 2. COVID-19 virus infection treated clinically is resolved 3. Pneumonia due to COVID-19 clinically is improving doing well with the weaning 4. ARDS slow improvement chest x-ray still showing some multifocal interstitial disease we need to continue to  monitor 5. Severe sepsis hemodynamics are stable we will continue with present management 6. Critical illness myopathy no changes are noted at this time   I have personally seen and evaluated the patient, evaluated laboratory and imaging results, formulated the assessment and plan and placed orders. The Patient requires high complexity decision making with multiple systems involvement.  Rounds were done with the Respiratory Therapy Director and Staff therapists and discussed with nursing staff also.  [name], MD [facility] Pulmonary Critical Care Medicine Sleep Medicine

## 2019-09-24 DIAGNOSIS — J9621 Acute and chronic respiratory failure with hypoxia: Secondary | ICD-10-CM | POA: Diagnosis not present

## 2019-09-24 DIAGNOSIS — G7281 Critical illness myopathy: Secondary | ICD-10-CM | POA: Diagnosis not present

## 2019-09-24 DIAGNOSIS — U071 COVID-19: Secondary | ICD-10-CM | POA: Diagnosis not present

## 2019-09-24 DIAGNOSIS — A419 Sepsis, unspecified organism: Secondary | ICD-10-CM | POA: Diagnosis not present

## 2019-09-24 NOTE — Progress Notes (Signed)
Pulmonary Critical Care Medicine [facility] [place]   PULMONARY CRITICAL CARE SERVICE  PROGRESS NOTE  Date of Service: 09/24/2019  Chad Nash.  [id number]  DOB: 30-Sep-1950   DOA: 09/13/2019  Referring Physician: Carron Curie, MD  HPI: Chad Nash. is a 69 y.o. male seen for follow up of Acute on Chronic Respiratory Failure.  Patient is on the NAG device right now is on 4 L oxygen with a goal of 20 hours  Medications: Reviewed on Rounds  Physical Exam:  Vitals: Temperature 97.6 pulse 104 respiratory rate 22 blood pressure is 124/92 saturations 98%  Ventilator Settings off the ventilator on the NAG device  . General: Comfortable at this time . Eyes: Grossly normal lids, irises & conjunctiva . ENT: grossly tongue is normal . Neck: no obvious mass . Cardiovascular: S1 S2 normal no gallop . Respiratory: No rhonchi no rales are noted at this time . Abdomen: soft . Skin: no rash seen on limited exam . Musculoskeletal: not rigid . Psychiatric:unable to assess . Neurologic: no seizure no involuntary movements         Lab Data:   Basic Metabolic Panel: Recent Labs  Lab 09/18/19 0600 09/20/19 0534 09/21/19 0545  NA 140 142 140  K 3.8 3.5 3.6  CL 103 102 102  CO2 26 29 31   GLUCOSE 146* 121* 114*  BUN 22 27* 25*  CREATININE <0.30* 0.40* 0.38*  CALCIUM 9.1 9.2 8.7*    ABG: No results for input(s): PHART, PCO2ART, PO2ART, HCO3, O2SAT in the last 168 hours.  Liver Function Tests: No results for input(s): AST, ALT, ALKPHOS, BILITOT, PROT, ALBUMIN in the last 168 hours. No results for input(s): LIPASE, AMYLASE in the last 168 hours. No results for input(s): AMMONIA in the last 168 hours.  CBC: Recent Labs  Lab 09/18/19 0600 09/20/19 0534 09/21/19 0545  WBC 12.3* 10.0 7.8  HGB 10.8* 11.2* 10.3*  HCT 32.8* 34.7* 32.2*  MCV 90.6 91.8 92.0  PLT 396 362 326    Cardiac Enzymes: No results for input(s): CKTOTAL, CKMB, CKMBINDEX,  TROPONINI in the last 168 hours.  BNP (last 3 results) No results for input(s): BNP in the last 8760 hours.  ProBNP (last 3 results) No results for input(s): PROBNP in the last 8760 hours.  Radiological Exams: No results found.  Assessment/Plan Active Problems:   Acute on chronic respiratory failure with hypoxia (HCC)   COVID-19 virus infection   Pneumonia due to COVID-19 virus   Acute respiratory distress syndrome (ARDS) due to 2019 novel coronavirus (HCC)   Severe sepsis (HCC)   Critical illness myopathy   1. Acute on chronic respiratory failure hypoxia we will continue weaning on protocol the goal today is 20 hours off the vent 2. COVID-19 virus infection treated we will continue with supportive care 3. Pneumonia due to COVID-19 treated 4. ARDS resolving slowly however 5. Severe sepsis hemodynamics are stable 6. Critical illness myopathy at baseline we will continue to follow along   I have personally seen and evaluated the patient, evaluated laboratory and imaging results, formulated the assessment and plan and placed orders. The Patient requires high complexity decision making with multiple systems involvement.  Rounds were done with the Respiratory Therapy Director and Staff therapists and discussed with nursing staff also.  2020, MD [facility] Pulmonary Critical Care Medicine Sleep Medicine

## 2019-09-25 DIAGNOSIS — J9621 Acute and chronic respiratory failure with hypoxia: Secondary | ICD-10-CM | POA: Diagnosis not present

## 2019-09-25 DIAGNOSIS — U071 COVID-19: Secondary | ICD-10-CM | POA: Diagnosis not present

## 2019-09-25 DIAGNOSIS — A419 Sepsis, unspecified organism: Secondary | ICD-10-CM | POA: Diagnosis not present

## 2019-09-25 DIAGNOSIS — G7281 Critical illness myopathy: Secondary | ICD-10-CM | POA: Diagnosis not present

## 2019-09-25 NOTE — Progress Notes (Signed)
Pulmonary Critical Care Medicine [facility] [place]   PULMONARY CRITICAL CARE SERVICE  PROGRESS NOTE  Date of Service: 09/25/2019  Chad Nash.  [id number]  DOB: [birth year]   DOA: 09/13/2019  Referring Physician: Carron Curie, MD  HPI: Chad Nash. is a 69 y.o. male seen for follow up of Acute on Chronic Respiratory Failure.  Patient is on the nag device right now.  To be comfortable right now without distress  Medications: Reviewed on Rounds  Physical Exam:  Vitals: Temperature 97.1 pulse 94 respiratory rate 30 blood pressure is 142/78 saturations 99%  Ventilator Settings off the ventilator on the NAG  . General: Comfortable at this time . Eyes: Grossly normal lids, irises & conjunctiva . ENT: grossly tongue is normal . Neck: no obvious mass . Cardiovascular: S1 S2 normal no gallop . Respiratory: No rhonchi coarse breath sounds . Abdomen: soft . Skin: no rash seen on limited exam . Musculoskeletal: not rigid . Psychiatric:unable to assess . Neurologic: no seizure no involuntary movements         Lab Data:   Basic Metabolic Panel: Recent Labs  Lab 09/20/19 0534 09/21/19 0545  NA 142 140  K 3.5 3.6  CL 102 102  CO2 29 31  GLUCOSE 121* 114*  BUN 27* 25*  CREATININE 0.40* 0.38*  CALCIUM 9.2 8.7*    ABG: No results for input(s): PHART, PCO2ART, PO2ART, HCO3, O2SAT in the last 168 hours.  Liver Function Tests: No results for input(s): AST, ALT, ALKPHOS, BILITOT, PROT, ALBUMIN in the last 168 hours. No results for input(s): LIPASE, AMYLASE in the last 168 hours. No results for input(s): AMMONIA in the last 168 hours.  CBC: Recent Labs  Lab 09/20/19 0534 09/21/19 0545  WBC 10.0 7.8  HGB 11.2* 10.3*  HCT 34.7* 32.2*  MCV 91.8 92.0  PLT 362 326    Cardiac Enzymes: No results for input(s): CKTOTAL, CKMB, CKMBINDEX, TROPONINI in the last 168 hours.  BNP (last 3 results) No results for input(s): BNP in the last 8760  hours.  ProBNP (last 3 results) No results for input(s): PROBNP in the last 8760 hours.  Radiological Exams: No results found.  Assessment/Plan Active Problems:   Acute on chronic respiratory failure with hypoxia (HCC)   COVID-19 virus infection   Pneumonia due to COVID-19 virus   Acute respiratory distress syndrome (ARDS) due to 2019 novel coronavirus (HCC)   Severe sepsis (HCC)   Critical illness myopathy   1. Acute on chronic respiratory failure hypoxia plan continue to wean on protocol the goal is for 24 hours off the ventilator. 2. COVID-19 virus infection treated we will continue with supportive care 3. Pneumonia due to COVID-19 will continue with present management appears to be improving 4. ARDS we will continue with supportive care 5. Severe sepsis hemodynamics are stable 6. Critical illness myopathy no change we will continue with present management   I have personally seen and evaluated the patient, evaluated laboratory and imaging results, formulated the assessment and plan and placed orders. The Patient requires high complexity decision making with multiple systems involvement.  Rounds were done with the Respiratory Therapy Director and Staff therapists and discussed with nursing staff also.  [name], MD [facility] Pulmonary Critical Care Medicine Sleep Medicine

## 2019-09-26 ENCOUNTER — Other Ambulatory Visit (HOSPITAL_COMMUNITY): Payer: Medicare Other

## 2019-09-26 DIAGNOSIS — A419 Sepsis, unspecified organism: Secondary | ICD-10-CM | POA: Diagnosis not present

## 2019-09-26 DIAGNOSIS — U071 COVID-19: Secondary | ICD-10-CM | POA: Diagnosis not present

## 2019-09-26 DIAGNOSIS — J9621 Acute and chronic respiratory failure with hypoxia: Secondary | ICD-10-CM | POA: Diagnosis not present

## 2019-09-26 DIAGNOSIS — G7281 Critical illness myopathy: Secondary | ICD-10-CM | POA: Diagnosis not present

## 2019-09-26 LAB — URINALYSIS, ROUTINE W REFLEX MICROSCOPIC
Bilirubin Urine: NEGATIVE
Glucose, UA: NEGATIVE mg/dL
Ketones, ur: NEGATIVE mg/dL
Nitrite: NEGATIVE
Protein, ur: 100 mg/dL — AB
RBC / HPF: >50 RBC/hpf — ABNORMAL HIGH (ref 0–5)
Specific Gravity, Urine: 1.019 (ref 1.005–1.030)
pH: 6 (ref 5.0–8.0)

## 2019-09-26 MED ORDER — GENERIC EXTERNAL MEDICATION
Status: DC
Start: ? — End: 2019-09-26

## 2019-09-26 NOTE — Progress Notes (Signed)
Pulmonary Critical Care Medicine [place]   PULMONARY CRITICAL CARE SERVICE  PROGRESS NOTE  Date of Service: 09/26/2019  Chad Mt.  [id number]  DOB: 01-10-1951   DOA: 09/13/2019  Referring Physician: Merton Border, MD  HPI: Chad Chaves. is a 69 y.o. male seen for follow up of Acute on Chronic Respiratory Failure.  Patient currently is off the ventilator on 2 L oxygen on the NAG  Medications: Reviewed on Rounds  Physical Exam:  Vitals: Temperature 96.4 pulse of 74 respiratory rate 25 blood pressure is 118/60 saturations 100%  Ventilator Settings off the ventilator on 3 L with the NAG  . General: Comfortable at this time . Eyes: Grossly normal lids, irises & conjunctiva . ENT: grossly tongue is normal . Neck: no obvious mass . Cardiovascular: S1 S2 normal no gallop . Respiratory: No rhonchi no rales are noted at this time . Abdomen: soft . Skin: no rash seen on limited exam . Musculoskeletal: not rigid . Psychiatric:unable to assess . Neurologic: no seizure no involuntary movements         Lab Data:   Basic Metabolic Panel: Recent Labs  Lab 09/20/19 0534 09/21/19 0545  NA 142 140  K 3.5 3.6  CL 102 102  CO2 29 31  GLUCOSE 121* 114*  BUN 27* 25*  CREATININE 0.40* 0.38*  CALCIUM 9.2 8.7*    ABG: No results for input(s): PHART, PCO2ART, PO2ART, HCO3, O2SAT in the last 168 hours.  Liver Function Tests: No results for input(s): AST, ALT, ALKPHOS, BILITOT, PROT, ALBUMIN in the last 168 hours. No results for input(s): LIPASE, AMYLASE in the last 168 hours. No results for input(s): AMMONIA in the last 168 hours.  CBC: Recent Labs  Lab 09/20/19 0534 09/21/19 0545  WBC 10.0 7.8  HGB 11.2* 10.3*  HCT 34.7* 32.2*  MCV 91.8 92.0  PLT 362 326    Cardiac Enzymes: No results for input(s): CKTOTAL, CKMB, CKMBINDEX, TROPONINI in the last 168 hours.  BNP (last 3 results) No results for input(s): BNP in the last  8760 hours.  ProBNP (last 3 results) No results for input(s): PROBNP in the last 8760 hours.  Radiological Exams: DG Chest Port 1 View  Result Date: 09/26/2019 CLINICAL DATA:  Respiratory failure. EXAM: PORTABLE CHEST 1 VIEW COMPARISON:  September 22, 2019. FINDINGS: Stable cardiomegaly. Tracheostomy and feeding tubes are unchanged in position. No pneumothorax is noted. Stable bilateral diffuse interstitial and airspace opacities are noted concerning for multifocal pneumonia. Small left pleural effusion cannot be excluded. Bony thorax is unremarkable. IMPRESSION: Stable support apparatus. Stable bilateral diffuse interstitial and airspace opacities are noted concerning for multifocal pneumonia. Small left pleural effusion cannot be excluded. Electronically Signed   By: Marijo Conception M.D.   On: 09/26/2019 07:24    Assessment/Plan Active Problems:   Acute on chronic respiratory failure with hypoxia (HCC)   COVID-19 virus infection   Pneumonia due to COVID-19 virus   Acute respiratory distress syndrome (ARDS) due to 2019 novel coronavirus (HCC)   Severe sepsis (HCC)   Critical illness myopathy   1. Acute on chronic respiratory failure with hypoxia plan is to continue with the NAG device.  Now is doing well with 2 L today the goal will be 24 hours 2. COVID-19 virus infection treated resolved 3. Critical illness myopathy continue with supportive care 4. Pneumonia due to COVID-19 treated slowly improving 5. ARDS secondary to COVID-19 in recovery phase 6. Severe sepsis resolved hemodynamics are  stable   I have personally seen and evaluated the patient, evaluated laboratory and imaging results, formulated the assessment and plan and placed orders. The Patient requires high complexity decision making with multiple systems involvement.  Rounds were done with the Respiratory Therapy Director and Staff therapists and discussed with nursing staff also.  [name], MD [facility] Pulmonary Critical  Care Medicine Sleep Medicine

## 2019-09-27 DIAGNOSIS — G7281 Critical illness myopathy: Secondary | ICD-10-CM | POA: Diagnosis not present

## 2019-09-27 DIAGNOSIS — U071 COVID-19: Secondary | ICD-10-CM | POA: Diagnosis not present

## 2019-09-27 DIAGNOSIS — A419 Sepsis, unspecified organism: Secondary | ICD-10-CM | POA: Diagnosis not present

## 2019-09-27 DIAGNOSIS — J9621 Acute and chronic respiratory failure with hypoxia: Secondary | ICD-10-CM | POA: Diagnosis not present

## 2019-09-27 LAB — BASIC METABOLIC PANEL
Anion gap: 14 (ref 5–15)
BUN: 20 mg/dL (ref 8–23)
CO2: 24 mmol/L (ref 22–32)
Calcium: 9.3 mg/dL (ref 8.9–10.3)
Chloride: 103 mmol/L (ref 98–111)
Creatinine, Ser: 0.31 mg/dL — ABNORMAL LOW (ref 0.61–1.24)
GFR calc Af Amer: >60 mL/min (ref >60)
GFR calc non Af Amer: >60 mL/min (ref >60)
Glucose, Bld: 123 mg/dL — ABNORMAL HIGH (ref 70–99)
Potassium: 3.6 mmol/L (ref 3.5–5.1)
Sodium: 141 mmol/L (ref 135–145)

## 2019-09-27 LAB — CBC
HCT: 36.4 % — ABNORMAL LOW (ref 39.0–52.0)
Hemoglobin: 11.4 g/dL — ABNORMAL LOW (ref 13.0–17.0)
MCH: 28.8 pg (ref 26.0–34.0)
MCHC: 31.3 g/dL (ref 30.0–36.0)
MCV: 91.9 fL (ref 80.0–100.0)
Platelets: 220 10*3/uL (ref 150–400)
RBC: 3.96 MIL/uL — ABNORMAL LOW (ref 4.22–5.81)
RDW: 14.7 % (ref 11.5–15.5)
WBC: 12.3 10*3/uL — ABNORMAL HIGH (ref 4.0–10.5)
nRBC: 0 % (ref 0.0–0.2)

## 2019-09-27 NOTE — Progress Notes (Signed)
Pulmonary Critical Care Medicine [facility] [place]   PULMONARY CRITICAL CARE SERVICE  PROGRESS NOTE  Date of Service: 09/27/2019  Chad Nash.  [id number]  DOB: 19-Aug-1951   DOA: 09/13/2019  Referring Physician: Carron Curie, MD  HPI: Chad Nash. is a 69 y.o. male seen for follow up of Acute on Chronic Respiratory Failure.  Patient is off the ventilator on nag device currently is on 2 L oxygen will be more than 24 hours  Medications: Reviewed on Rounds  Physical Exam:  Vitals: Temperature 96.6 pulse 81 respiratory 28 blood pressure is 117/72 saturations 99%  Ventilator Settings off the ventilator on the NAG  . General: Comfortable at this time . Eyes: Grossly normal lids, irises & conjunctiva . ENT: grossly tongue is normal . Neck: no obvious mass . Cardiovascular: S1 S2 normal no gallop . Respiratory: No rhonchi no rales are noted at this time . Abdomen: soft . Skin: no rash seen on limited exam . Musculoskeletal: not rigid . Psychiatric:unable to assess . Neurologic: no seizure no involuntary movements         Lab Data:   Basic Metabolic Panel: Recent Labs  Lab 09/21/19 0545 09/27/19 0452  NA 140 141  K 3.6 3.6  CL 102 103  CO2 31 24  GLUCOSE 114* 123*  BUN 25* 20  CREATININE 0.38* 0.31*  CALCIUM 8.7* 9.3    ABG: No results for input(s): PHART, PCO2ART, PO2ART, HCO3, O2SAT in the last 168 hours.  Liver Function Tests: No results for input(s): AST, ALT, ALKPHOS, BILITOT, PROT, ALBUMIN in the last 168 hours. No results for input(s): LIPASE, AMYLASE in the last 168 hours. No results for input(s): AMMONIA in the last 168 hours.  CBC: Recent Labs  Lab 09/21/19 0545 09/27/19 0452  WBC 7.8 12.3*  HGB 10.3* 11.4*  HCT 32.2* 36.4*  MCV 92.0 91.9  PLT 326 220    Cardiac Enzymes: No results for input(s): CKTOTAL, CKMB, CKMBINDEX, TROPONINI in the last 168 hours.  BNP (last 3 results) No results for input(s):  BNP in the last 8760 hours.  ProBNP (last 3 results) No results for input(s): PROBNP in the last 8760 hours.  Radiological Exams: DG Chest Port 1 View  Result Date: 09/26/2019 CLINICAL DATA:  Respiratory failure. EXAM: PORTABLE CHEST 1 VIEW COMPARISON:  September 22, 2019. FINDINGS: Stable cardiomegaly. Tracheostomy and feeding tubes are unchanged in position. No pneumothorax is noted. Stable bilateral diffuse interstitial and airspace opacities are noted concerning for multifocal pneumonia. Small left pleural effusion cannot be excluded. Bony thorax is unremarkable. IMPRESSION: Stable support apparatus. Stable bilateral diffuse interstitial and airspace opacities are noted concerning for multifocal pneumonia. Small left pleural effusion cannot be excluded. Electronically Signed   By: Lupita Raider M.D.   On: 09/26/2019 07:24    Assessment/Plan Active Problems:   Acute on chronic respiratory failure with hypoxia (HCC)   COVID-19 virus infection   Pneumonia due to COVID-19 virus   Acute respiratory distress syndrome (ARDS) due to 2019 novel coronavirus (HCC)   Severe sepsis (HCC)   Critical illness myopathy   1. Acute on chronic respiratory failure hypoxia plan is to continue with NAG 2 L patient is off for more than 24 hours we will continue to advance on weaning as tolerated. 2. COVID-19 virus infection in recovery phase 3. Pneumonia due to COVID-19 still with multifocal infiltrates we will continue with supportive care 4. ARDS slowly improving we will continue to monitor 5. Severe  sepsis hemodynamics are stable 6. Critical illness myopathy no changes   I have personally seen and evaluated the patient, evaluated laboratory and imaging results, formulated the assessment and plan and placed orders. The Patient requires high complexity decision making with multiple systems involvement.  Rounds were done with the Respiratory Therapy Director and Staff therapists and discussed with nursing  staff also.  Allyne Gee, MD [facility] Pulmonary Critical Care Medicine Sleep Medicine

## 2019-09-28 ENCOUNTER — Other Ambulatory Visit (HOSPITAL_COMMUNITY): Payer: Medicare Other

## 2019-09-28 DIAGNOSIS — G7281 Critical illness myopathy: Secondary | ICD-10-CM | POA: Diagnosis not present

## 2019-09-28 DIAGNOSIS — A419 Sepsis, unspecified organism: Secondary | ICD-10-CM | POA: Diagnosis not present

## 2019-09-28 DIAGNOSIS — U071 COVID-19: Secondary | ICD-10-CM | POA: Diagnosis not present

## 2019-09-28 DIAGNOSIS — J9621 Acute and chronic respiratory failure with hypoxia: Secondary | ICD-10-CM | POA: Diagnosis not present

## 2019-09-28 NOTE — Progress Notes (Signed)
Pulmonary Critical Care Medicine [facility] [place]   PULMONARY CRITICAL CARE SERVICE  PROGRESS NOTE  Date of Service: 09/28/2019  Chad Nash.  [id number]  DOB: October 26, 1950   DOA: 09/13/2019  Referring Physician: Carron Curie, MD  HPI: Chad Nash. is a 69 y.o. male seen for follow up of Acute on Chronic Respiratory Failure.  Patient currently is on the NAG device has been on for 48 hours  Medications: Reviewed on Rounds  Physical Exam:  Vitals: Temperature is 96.4 pulse 73 respiratory 24 blood pressure is 132/74 saturations 100%  Ventilator Settings off the ventilator on the NAG  . General: Comfortable at this time . Eyes: Grossly normal lids, irises & conjunctiva . ENT: grossly tongue is normal . Neck: no obvious mass . Cardiovascular: S1 S2 normal no gallop . Respiratory: No rhonchi no rales are noted at this time . Abdomen: soft . Skin: no rash seen on limited exam . Musculoskeletal: not rigid . Psychiatric:unable to assess . Neurologic: no seizure no involuntary movements         Lab Data:   Basic Metabolic Panel: Recent Labs  Lab 09/27/19 0452  NA 141  K 3.6  CL 103  CO2 24  GLUCOSE 123*  BUN 20  CREATININE 0.31*  CALCIUM 9.3    ABG: No results for input(s): PHART, PCO2ART, PO2ART, HCO3, O2SAT in the last 168 hours.  Liver Function Tests: No results for input(s): AST, ALT, ALKPHOS, BILITOT, PROT, ALBUMIN in the last 168 hours. No results for input(s): LIPASE, AMYLASE in the last 168 hours. No results for input(s): AMMONIA in the last 168 hours.  CBC: Recent Labs  Lab 09/27/19 0452  WBC 12.3*  HGB 11.4*  HCT 36.4*  MCV 91.9  PLT 220    Cardiac Enzymes: No results for input(s): CKTOTAL, CKMB, CKMBINDEX, TROPONINI in the last 168 hours.  BNP (last 3 results) No results for input(s): BNP in the last 8760 hours.  ProBNP (last 3 results) No results for input(s): PROBNP in the last 8760  hours.  Radiological Exams: No results found.  Assessment/Plan Active Problems:   Acute on chronic respiratory failure with hypoxia (HCC)   COVID-19 virus infection   Pneumonia due to COVID-19 virus   Acute respiratory distress syndrome (ARDS) due to 2019 novel coronavirus (HCC)   Severe sepsis (HCC)   Critical illness myopathy   1. Acute on chronic respiratory failure with hypoxia plan continue on the wean protocol advance as tolerated hopefully we should be able to begin capping 2. COVID-19 virus infection treated resolved 3. Pneumonia due to COVID-19 clinically improving 4. ARDS clinically improving 5. Severe sepsis resolved 6. Critical illness myopathy no changes noted at this time   I have personally seen and evaluated the patient, evaluated laboratory and imaging results, formulated the assessment and plan and placed orders. The Patient requires high complexity decision making with multiple systems involvement.  Rounds were done with the Respiratory Therapy Director and Staff therapists and discussed with nursing staff also.  [name], MD [facility] Pulmonary Critical Care Medicine Sleep Medicine

## 2019-09-29 ENCOUNTER — Other Ambulatory Visit (HOSPITAL_COMMUNITY): Payer: Medicare Other

## 2019-09-29 DIAGNOSIS — G7281 Critical illness myopathy: Secondary | ICD-10-CM | POA: Diagnosis not present

## 2019-09-29 DIAGNOSIS — U071 COVID-19: Secondary | ICD-10-CM | POA: Diagnosis not present

## 2019-09-29 DIAGNOSIS — A419 Sepsis, unspecified organism: Secondary | ICD-10-CM | POA: Diagnosis not present

## 2019-09-29 DIAGNOSIS — J9621 Acute and chronic respiratory failure with hypoxia: Secondary | ICD-10-CM | POA: Diagnosis not present

## 2019-09-29 NOTE — Progress Notes (Addendum)
Pulmonary Critical Care Medicine [place]   PULMONARY CRITICAL CARE SERVICE  PROGRESS NOTE  Date of Service: 09/29/2019  Alexander Mt.  EYC:[id number]  DOB: [birth year]   DOA: 09/13/2019  Referring Physician: Merton Border, MD  HPI: Abner Ardis. is a 69 y.o. male seen for follow up of Acute on Chronic Respiratory Failure.  Patient is currently on nag at 4 L for 24-hour goal at this time satting well with no distress.  Medications: Reviewed on Rounds  Physical Exam:  Vitals: Pulse 95 respirations 28 BP 130/85 O2 sat 99% temp 98.4  Ventilator Settings nag 4 L  . General: Comfortable at this time . Eyes: Grossly normal lids, irises & conjunctiva . ENT: grossly tongue is normal . Neck: no obvious mass . Cardiovascular: S1 S2 normal no gallop . Respiratory: No rales or rhonchi noted . Abdomen: soft . Skin: no rash seen on limited exam . Musculoskeletal: not rigid . Psychiatric:unable to assess . Neurologic: no seizure no involuntary movements         Lab Data:   Basic Metabolic Panel: Recent Labs  Lab 09/27/19 0452  NA 141  K 3.6  CL 103  CO2 24  GLUCOSE 123*  BUN 20  CREATININE 0.31*  CALCIUM 9.3    ABG: No results for input(s): PHART, PCO2ART, PO2ART, HCO3, O2SAT in the last 168 hours.  Liver Function Tests: No results for input(s): AST, ALT, ALKPHOS, BILITOT, PROT, ALBUMIN in the last 168 hours. No results for input(s): LIPASE, AMYLASE in the last 168 hours. No results for input(s): AMMONIA in the last 168 hours.  CBC: Recent Labs  Lab 09/27/19 0452  WBC 12.3*  HGB 11.4*  HCT 36.4*  MCV 91.9  PLT 220    Cardiac Enzymes: No results for input(s): CKTOTAL, CKMB, CKMBINDEX, TROPONINI in the last 168 hours.  BNP (last 3 results) No results for input(s): BNP in the last 8760 hours.  ProBNP (last 3 results) No results for input(s): PROBNP in the last 8760 hours.  Radiological Exams: DG CHEST PORT 1  VIEW  Result Date: 09/29/2019 CLINICAL DATA:  Pneumonia. EXAM: PORTABLE CHEST 1 VIEW COMPARISON:  September 28, 2019. FINDINGS: Stable cardiomegaly. Tracheostomy and nasogastric tubes are unchanged. Mild central pulmonary vascular congestion is noted. No pneumothorax is noted. Grossly stable bilateral ill-defined lung opacities are noted concerning for multifocal pneumonia. Hypoinflation of the lungs is noted. Bony thorax is unremarkable. IMPRESSION: Stable support apparatus. Stable bilateral lung opacities are noted concerning for multifocal pneumonia. Hypoinflation of the lungs is noted. Electronically Signed   By: Marijo Conception M.D.   On: 09/29/2019 11:53   DG Chest Port 1 View  Result Date: 09/28/2019 CLINICAL DATA:  69 year old male status post nasogastric tube placement. EXAM: PORTABLE CHEST 1 VIEW COMPARISON:  Chest x-ray 09/26/2019. FINDINGS: A tracheostomy tube is in place with tip 4.9 cm above the carina. Nasogastric tube with tip coiled in the antral pre-pyloric region of the stomach. Lung volumes are low. Patchy multifocal ill-defined opacities and areas of extensive interstitial prominence most evident throughout the periphery of the mid to lower lungs bilaterally, similar to the prior study. No pleural effusions. No evidence of pulmonary edema. Heart size appears borderline to mildly enlarged. The patient is rotated to the right on today's exam, resulting in distortion of the mediastinal contours and reduced diagnostic sensitivity and specificity for mediastinal pathology. Aortic atherosclerosis. IMPRESSION: 1. Support apparatus as above. 2. Low lung volumes with persistent multilobar bilateral  pneumonia. Overall, aeration appears unchanged compared to the prior study. 3. Aortic atherosclerosis. Electronically Signed   By: Trudie Reed M.D.   On: 09/28/2019 16:42   DG Abd Portable 1V  Result Date: 09/29/2019 CLINICAL DATA:  Check gastric catheter placement EXAM: PORTABLE ABDOMEN - 1 VIEW  COMPARISON:  None. FINDINGS: Scattered large and small bowel gas is noted. Gastric catheter is noted within the stomach. No free air is seen. IMPRESSION: Gastric catheter within the stomach. Electronically Signed   By: Alcide Clever M.D.   On: 09/29/2019 00:45    Assessment/Plan Active Problems:   Acute on chronic respiratory failure with hypoxia (HCC)   COVID-19 virus infection   Pneumonia due to COVID-19 virus   Acute respiratory distress syndrome (ARDS) due to 2019 novel coronavirus (HCC)   Severe sepsis (HCC)   Critical illness myopathy   1. Acute on chronic respiratory failure with hypoxia continue with nag at this time for 24-hour goal will progress to capping short soon. 2. COVID-19 virus infection treated resolved 3. Pneumonia due to COVID-19 clinically improving 4. ARDS clinically improving 5. Severe sepsis resolved 6. Critical illness myopathy no changes noted at this time   I have personally seen and evaluated the patient, evaluated laboratory and imaging results, formulated the assessment and plan and placed orders. The Patient requires high complexity decision making with multiple systems involvement.  Rounds were done with the Respiratory Therapy Director and Staff therapists and discussed with nursing staff also.  [name], MD [facility] Pulmonary Critical Care Medicine Sleep Medicine

## 2019-09-30 DIAGNOSIS — G7281 Critical illness myopathy: Secondary | ICD-10-CM | POA: Diagnosis not present

## 2019-09-30 DIAGNOSIS — A419 Sepsis, unspecified organism: Secondary | ICD-10-CM | POA: Diagnosis not present

## 2019-09-30 DIAGNOSIS — J9621 Acute and chronic respiratory failure with hypoxia: Secondary | ICD-10-CM | POA: Diagnosis not present

## 2019-09-30 DIAGNOSIS — U071 COVID-19: Secondary | ICD-10-CM | POA: Diagnosis not present

## 2019-09-30 LAB — BASIC METABOLIC PANEL
Anion gap: 10 (ref 5–15)
BUN: 21 mg/dL (ref 8–23)
CO2: 29 mmol/L (ref 22–32)
Calcium: 9.5 mg/dL (ref 8.9–10.3)
Chloride: 104 mmol/L (ref 98–111)
Creatinine, Ser: 0.38 mg/dL — ABNORMAL LOW (ref 0.61–1.24)
GFR calc Af Amer: >60 mL/min (ref >60)
GFR calc non Af Amer: >60 mL/min (ref >60)
Glucose, Bld: 116 mg/dL — ABNORMAL HIGH (ref 70–99)
Potassium: 3.8 mmol/L (ref 3.5–5.1)
Sodium: 143 mmol/L (ref 135–145)

## 2019-09-30 LAB — CBC
HCT: 36 % — ABNORMAL LOW (ref 39.0–52.0)
Hemoglobin: 11.2 g/dL — ABNORMAL LOW (ref 13.0–17.0)
MCH: 29.4 pg (ref 26.0–34.0)
MCHC: 31.1 g/dL (ref 30.0–36.0)
MCV: 94.5 fL (ref 80.0–100.0)
Platelets: 232 10*3/uL (ref 150–400)
RBC: 3.81 MIL/uL — ABNORMAL LOW (ref 4.22–5.81)
RDW: 14.9 % (ref 11.5–15.5)
WBC: 11.3 10*3/uL — ABNORMAL HIGH (ref 4.0–10.5)
nRBC: 0 % (ref 0.0–0.2)

## 2019-09-30 LAB — MAGNESIUM: Magnesium: 1.9 mg/dL (ref 1.7–2.4)

## 2019-09-30 NOTE — Progress Notes (Signed)
Pulmonary Critical Care Medicine [facility]   PULMONARY CRITICAL CARE SERVICE  PROGRESS NOTE  Date of Service: 09/30/2019  Chad Mt.  [id number]  DOB: 12/02/1950   DOA: 09/13/2019  Referring Physician: Merton Border, MD  HPI: Chad Nash. is a 69 y.o. male seen for follow up of Acute on Chronic Respiratory Failure.  Patient currently is off the ventilator on the NAG and is on 4 L oxygen  Medications: Reviewed on Rounds  Physical Exam:  Vitals: Temperature 97.1 pulse 79 respiratory 20 blood pressure 122/71 saturations 100%  Ventilator Settings off the ventilator on the NAG currently on 4 L oxygen  . General: Comfortable at this time . Eyes: Grossly normal lids, irises & conjunctiva . ENT: grossly tongue is normal . Neck: no obvious mass . Cardiovascular: S1 S2 normal no gallop . Respiratory: No rhonchi no rales are noted at this time . Abdomen: soft . Skin: no rash seen on limited exam . Musculoskeletal: not rigid . Psychiatric:unable to assess . Neurologic: no seizure no involuntary movements         Lab Data:   Basic Metabolic Panel: Recent Labs  Lab 09/27/19 0452 09/30/19 0824  NA 141 143  K 3.6 3.8  CL 103 104  CO2 24 29  GLUCOSE 123* 116*  BUN 20 21  CREATININE 0.31* 0.38*  CALCIUM 9.3 9.5  MG  --  1.9    ABG: No results for input(s): PHART, PCO2ART, PO2ART, HCO3, O2SAT in the last 168 hours.  Liver Function Tests: No results for input(s): AST, ALT, ALKPHOS, BILITOT, PROT, ALBUMIN in the last 168 hours. No results for input(s): LIPASE, AMYLASE in the last 168 hours. No results for input(s): AMMONIA in the last 168 hours.  CBC: Recent Labs  Lab 09/27/19 0452 09/30/19 0824  WBC 12.3* 11.3*  HGB 11.4* 11.2*  HCT 36.4* 36.0*  MCV 91.9 94.5  PLT 220 232    Cardiac Enzymes: No results for input(s): CKTOTAL, CKMB, CKMBINDEX, TROPONINI in the last 168 hours.  BNP (last 3 results) No results for  input(s): BNP in the last 8760 hours.  ProBNP (last 3 results) No results for input(s): PROBNP in the last 8760 hours.  Radiological Exams: DG CHEST PORT 1 VIEW  Result Date: 09/29/2019 CLINICAL DATA:  Pneumonia. EXAM: PORTABLE CHEST 1 VIEW COMPARISON:  September 28, 2019. FINDINGS: Stable cardiomegaly. Tracheostomy and nasogastric tubes are unchanged. Mild central pulmonary vascular congestion is noted. No pneumothorax is noted. Grossly stable bilateral ill-defined lung opacities are noted concerning for multifocal pneumonia. Hypoinflation of the lungs is noted. Bony thorax is unremarkable. IMPRESSION: Stable support apparatus. Stable bilateral lung opacities are noted concerning for multifocal pneumonia. Hypoinflation of the lungs is noted. Electronically Signed   By: Marijo Conception M.D.   On: 09/29/2019 11:53   DG Chest Port 1 View  Result Date: 09/28/2019 CLINICAL DATA:  69 year old male status post nasogastric tube placement. EXAM: PORTABLE CHEST 1 VIEW COMPARISON:  Chest x-ray 09/26/2019. FINDINGS: A tracheostomy tube is in place with tip 4.9 cm above the carina. Nasogastric tube with tip coiled in the antral pre-pyloric region of the stomach. Lung volumes are low. Patchy multifocal ill-defined opacities and areas of extensive interstitial prominence most evident throughout the periphery of the mid to lower lungs bilaterally, similar to the prior study. No pleural effusions. No evidence of pulmonary edema. Heart size appears borderline to mildly enlarged. The patient is rotated to the right on today's exam, resulting  in distortion of the mediastinal contours and reduced diagnostic sensitivity and specificity for mediastinal pathology. Aortic atherosclerosis. IMPRESSION: 1. Support apparatus as above. 2. Low lung volumes with persistent multilobar bilateral pneumonia. Overall, aeration appears unchanged compared to the prior study. 3. Aortic atherosclerosis. Electronically Signed   By: Trudie Reed M.D.   On: 09/28/2019 16:42   DG Abd Portable 1V  Result Date: 09/29/2019 CLINICAL DATA:  Check gastric catheter placement EXAM: PORTABLE ABDOMEN - 1 VIEW COMPARISON:  None. FINDINGS: Scattered large and small bowel gas is noted. Gastric catheter is noted within the stomach. No free air is seen. IMPRESSION: Gastric catheter within the stomach. Electronically Signed   By: Alcide Clever M.D.   On: 09/29/2019 00:45    Assessment/Plan Active Problems:   Acute on chronic respiratory failure with hypoxia (HCC)   COVID-19 virus infection   Pneumonia due to COVID-19 virus   Acute respiratory distress syndrome (ARDS) due to 2019 novel coronavirus (HCC)   Severe sepsis (HCC)   Critical illness myopathy   1. Acute on chronic respiratory failure with hypoxia continue on the wean protocol completed 24 hours on 4 L 2. COVID-19 virus infection treated clinically improving 3. Pneumonia due to COVID-19 improving we will continue to follow radiologically 4. ARDS improving also chest x-ray showing low volumes and multi lobar involvement 5. Severe sepsis hemodynamics are stable we will continue with present management   I have personally seen and evaluated the patient, evaluated laboratory and imaging results, formulated the assessment and plan and placed orders. The Patient requires high complexity decision making with multiple systems involvement.  Rounds were done with the Respiratory Therapy Director and Staff therapists and discussed with nursing staff also.  [name], MD [facility] Pulmonary Critical Care Medicine Sleep Medicine

## 2019-10-01 DIAGNOSIS — U071 COVID-19: Secondary | ICD-10-CM | POA: Diagnosis not present

## 2019-10-01 DIAGNOSIS — A419 Sepsis, unspecified organism: Secondary | ICD-10-CM | POA: Diagnosis not present

## 2019-10-01 DIAGNOSIS — G7281 Critical illness myopathy: Secondary | ICD-10-CM | POA: Diagnosis not present

## 2019-10-01 DIAGNOSIS — J9621 Acute and chronic respiratory failure with hypoxia: Secondary | ICD-10-CM | POA: Diagnosis not present

## 2019-10-01 NOTE — Progress Notes (Signed)
Pulmonary Critical Care Medicine [facility] [place]   PULMONARY CRITICAL CARE SERVICE  PROGRESS NOTE  Date of Service: 10/01/2019  Chad Nash.  [id number]  DOB: [birth year]   DOA: 09/13/2019  Referring Physician: Carron Curie, MD  HPI: Chad Nash. is a 69 y.o. male seen for follow up of Acute on Chronic Respiratory Failure.  Patient at this time is off the ventilator on the NAG has been requiring 2 L oxygen today will be 48 hours  Medications: Reviewed on Rounds  Physical Exam:  Vitals: Temperature 97.5 pulse 82 respiratory rate 24 blood pressure is 128/40 saturations 95%  Ventilator Settings off the ventilator on the NAG  . General: Comfortable at this time . Eyes: Grossly normal lids, irises & conjunctiva . ENT: grossly tongue is normal . Neck: no obvious mass . Cardiovascular: S1 S2 normal no gallop . Respiratory: No rhonchi no rales are noted at this time . Abdomen: soft . Skin: no rash seen on limited exam . Musculoskeletal: not rigid . Psychiatric:unable to assess . Neurologic: no seizure no involuntary movements         Lab Data:   Basic Metabolic Panel: Recent Labs  Lab 09/27/19 0452 09/30/19 0824  NA 141 143  K 3.6 3.8  CL 103 104  CO2 24 29  GLUCOSE 123* 116*  BUN 20 21  CREATININE 0.31* 0.38*  CALCIUM 9.3 9.5  MG  --  1.9    ABG: No results for input(s): PHART, PCO2ART, PO2ART, HCO3, O2SAT in the last 168 hours.  Liver Function Tests: No results for input(s): AST, ALT, ALKPHOS, BILITOT, PROT, ALBUMIN in the last 168 hours. No results for input(s): LIPASE, AMYLASE in the last 168 hours. No results for input(s): AMMONIA in the last 168 hours.  CBC: Recent Labs  Lab 09/27/19 0452 09/30/19 0824  WBC 12.3* 11.3*  HGB 11.4* 11.2*  HCT 36.4* 36.0*  MCV 91.9 94.5  PLT 220 232    Cardiac Enzymes: No results for input(s): CKTOTAL, CKMB, CKMBINDEX, TROPONINI in the last 168 hours.  BNP (last 3  results) No results for input(s): BNP in the last 8760 hours.  ProBNP (last 3 results) No results for input(s): PROBNP in the last 8760 hours.  Radiological Exams: No results found.  Assessment/Plan Active Problems:   Acute on chronic respiratory failure with hypoxia (HCC)   COVID-19 virus infection   Pneumonia due to COVID-19 virus   Acute respiratory distress syndrome (ARDS) due to 2019 novel coronavirus (HCC)   Severe sepsis (HCC)   Critical illness myopathy   1. Acute on chronic respiratory failure hypoxia plan is to continue to wean on the NAG patient right now is requiring 2 L oxygen tracheostomy will be changed out today also.  Patient hopefully will begin capping 2. COVID-19 virus infection resolved 3. Pneumonia due to COVID-19 treated 4. ARDS slowly improving we will continue to monitor 5. Severe sepsis resolved hemodynamics are stable 6. Critical illness myopathy needs ongoing therapy   I have personally seen and evaluated the patient, evaluated laboratory and imaging results, formulated the assessment and plan and placed orders. The Patient requires high complexity decision making with multiple systems involvement.  Rounds were done with the Respiratory Therapy Director and Staff therapists and discussed with nursing staff also.  [name], MD [facility] Pulmonary Critical Care Medicine Sleep Medicine

## 2019-10-02 DIAGNOSIS — G7281 Critical illness myopathy: Secondary | ICD-10-CM | POA: Diagnosis not present

## 2019-10-02 DIAGNOSIS — U071 COVID-19: Secondary | ICD-10-CM | POA: Diagnosis not present

## 2019-10-02 DIAGNOSIS — A419 Sepsis, unspecified organism: Secondary | ICD-10-CM | POA: Diagnosis not present

## 2019-10-02 DIAGNOSIS — J9621 Acute and chronic respiratory failure with hypoxia: Secondary | ICD-10-CM | POA: Diagnosis not present

## 2019-10-02 NOTE — Progress Notes (Signed)
Pulmonary Critical Care Medicine [facility] [place]   PULMONARY CRITICAL CARE SERVICE  PROGRESS NOTE  Date of Service: 10/02/2019  Chad Nash.  [id number]  DOB: July 08, 1951   DOA: 09/13/2019  Referring Physician: Carron Curie, MD  HPI: Chad Nash. is a 69 y.o. male seen for follow up of Acute on Chronic Respiratory Failure.  Patient currently has #6 tracheostomy in place has been doing well with capping trials requiring about 2 to 3 L of supplemental oxygen.  Medications: Reviewed on Rounds  Physical Exam:  Vitals: Temperature 96.5 pulse 99 respiratory 28 blood pressure is 153/81 saturations 96%  Ventilator Settings capping off the ventilator currently is on 3 L oxygen  . General: Comfortable at this time . Eyes: Grossly normal lids, irises & conjunctiva . ENT: grossly tongue is normal . Neck: no obvious mass . Cardiovascular: S1 S2 normal no gallop . Respiratory: No rhonchi coarse breath sounds are noted . Abdomen: soft . Skin: no rash seen on limited exam . Musculoskeletal: not rigid . Psychiatric:unable to assess . Neurologic: no seizure no involuntary movements         Lab Data:   Basic Metabolic Panel: Recent Labs  Lab 09/27/19 0452 09/30/19 0824  NA 141 143  K 3.6 3.8  CL 103 104  CO2 24 29  GLUCOSE 123* 116*  BUN 20 21  CREATININE 0.31* 0.38*  CALCIUM 9.3 9.5  MG  --  1.9    ABG: No results for input(s): PHART, PCO2ART, PO2ART, HCO3, O2SAT in the last 168 hours.  Liver Function Tests: No results for input(s): AST, ALT, ALKPHOS, BILITOT, PROT, ALBUMIN in the last 168 hours. No results for input(s): LIPASE, AMYLASE in the last 168 hours. No results for input(s): AMMONIA in the last 168 hours.  CBC: Recent Labs  Lab 09/27/19 0452 09/30/19 0824  WBC 12.3* 11.3*  HGB 11.4* 11.2*  HCT 36.4* 36.0*  MCV 91.9 94.5  PLT 220 232    Cardiac Enzymes: No results for input(s): CKTOTAL, CKMB, CKMBINDEX, TROPONINI  in the last 168 hours.  BNP (last 3 results) No results for input(s): BNP in the last 8760 hours.  ProBNP (last 3 results) No results for input(s): PROBNP in the last 8760 hours.  Radiological Exams: No results found.  Assessment/Plan Active Problems:   Acute on chronic respiratory failure with hypoxia (HCC)   COVID-19 virus infection   Pneumonia due to COVID-19 virus   Acute respiratory distress syndrome (ARDS) due to 2019 novel coronavirus (HCC)   Severe sepsis (HCC)   Critical illness myopathy   1. Acute on chronic respiratory failure with hypoxia we will continue with the wean protocol capping on 2 L to 3 L as already noted appears to be doing well 2. COVID-19 virus infection treated we will continue with supportive care 3. Pneumonia due to COVID-19 improving 4. ARDS also shows some signs of improvement we will continue to follow 5. Severe sepsis this is resolved 6. Critical illness myopathy physical therapy as tolerated   I have personally seen and evaluated the patient, evaluated laboratory and imaging results, formulated the assessment and plan and placed orders. The Patient requires high complexity decision making with multiple systems involvement.  Rounds were done with the Respiratory Therapy Director and Staff therapists and discussed with nursing staff also.  [name], MD [facility] Pulmonary Critical Care Medicine Sleep Medicine

## 2019-10-03 DIAGNOSIS — G7281 Critical illness myopathy: Secondary | ICD-10-CM | POA: Diagnosis not present

## 2019-10-03 DIAGNOSIS — J9621 Acute and chronic respiratory failure with hypoxia: Secondary | ICD-10-CM | POA: Diagnosis not present

## 2019-10-03 DIAGNOSIS — A419 Sepsis, unspecified organism: Secondary | ICD-10-CM | POA: Diagnosis not present

## 2019-10-03 DIAGNOSIS — U071 COVID-19: Secondary | ICD-10-CM | POA: Diagnosis not present

## 2019-10-03 NOTE — Progress Notes (Signed)
Pulmonary Critical Care Medicine [facility] [place]   PULMONARY CRITICAL CARE SERVICE  PROGRESS NOTE  Date of Service: 10/03/2019  Chad Nash.  [id number]  DOB: 11/22/1950   DOA: 09/13/2019  Referring Physician: Carron Curie, MD  HPI: Chad Nash. is a 69 y.o. male seen for follow up of Acute on Chronic Respiratory Failure.  Patient currently is capping on 2 L oxygen good saturations are noted.  We are working towards eventual decannulation  Medications: Reviewed on Rounds  Physical Exam:  Vitals: Temperature 97.2 pulse 85 respiratory 16 blood pressure is 145/78 saturations 97%  Ventilator Settings patient currently is off the ventilator on nasal cannula with 2 L  . General: Comfortable at this time . Eyes: Grossly normal lids, irises & conjunctiva . ENT: grossly tongue is normal . Neck: no obvious mass . Cardiovascular: S1 S2 normal no gallop . Respiratory: No rhonchi no rales are noted at this time . Abdomen: soft . Skin: no rash seen on limited exam . Musculoskeletal: not rigid . Psychiatric:unable to assess . Neurologic: no seizure no involuntary movements         Lab Data:   Basic Metabolic Panel: Recent Labs  Lab 09/27/19 0452 09/30/19 0824  NA 141 143  K 3.6 3.8  CL 103 104  CO2 24 29  GLUCOSE 123* 116*  BUN 20 21  CREATININE 0.31* 0.38*  CALCIUM 9.3 9.5  MG  --  1.9    ABG: No results for input(s): PHART, PCO2ART, PO2ART, HCO3, O2SAT in the last 168 hours.  Liver Function Tests: No results for input(s): AST, ALT, ALKPHOS, BILITOT, PROT, ALBUMIN in the last 168 hours. No results for input(s): LIPASE, AMYLASE in the last 168 hours. No results for input(s): AMMONIA in the last 168 hours.  CBC: Recent Labs  Lab 09/27/19 0452 09/30/19 0824  WBC 12.3* 11.3*  HGB 11.4* 11.2*  HCT 36.4* 36.0*  MCV 91.9 94.5  PLT 220 232    Cardiac Enzymes: No results for input(s): CKTOTAL, CKMB, CKMBINDEX, TROPONINI in  the last 168 hours.  BNP (last 3 results) No results for input(s): BNP in the last 8760 hours.  ProBNP (last 3 results) No results for input(s): PROBNP in the last 8760 hours.  Radiological Exams: No results found.  Assessment/Plan Active Problems:   Acute on chronic respiratory failure with hypoxia (HCC)   COVID-19 virus infection   Pneumonia due to COVID-19 virus   Acute respiratory distress syndrome (ARDS) due to 2019 novel coronavirus (HCC)   Severe sepsis (HCC)   Critical illness myopathy   1. Acute on chronic respiratory failure hypoxia plan is to continue with capping trials on 2 L continue secretion management supportive care 2. COVID-19 virus infection treated resolved 3. Pneumonia due to COVID-19 improving slowly 4. ARDS we will continue to follow 5. Severe sepsis hemodynamics are stable 6. Critical illness myopathy physical therapy as tolerated   I have personally seen and evaluated the patient, evaluated laboratory and imaging results, formulated the assessment and plan and placed orders. The Patient requires high complexity decision making with multiple systems involvement.  Rounds were done with the Respiratory Therapy Director and Staff therapists and discussed with nursing staff also.  [name], MD [facility] Pulmonary Critical Care Medicine Sleep Medicine

## 2019-10-04 DIAGNOSIS — J9621 Acute and chronic respiratory failure with hypoxia: Secondary | ICD-10-CM | POA: Diagnosis not present

## 2019-10-04 DIAGNOSIS — G7281 Critical illness myopathy: Secondary | ICD-10-CM | POA: Diagnosis not present

## 2019-10-04 DIAGNOSIS — A419 Sepsis, unspecified organism: Secondary | ICD-10-CM | POA: Diagnosis not present

## 2019-10-04 DIAGNOSIS — U071 COVID-19: Secondary | ICD-10-CM | POA: Diagnosis not present

## 2019-10-04 NOTE — Progress Notes (Signed)
Pulmonary Critical Care Medicine [facility] [place]   PULMONARY CRITICAL CARE SERVICE  PROGRESS NOTE  Date of Service: 10/04/2019  Chad Nash.  [id number]  DOB: [birth year]   DOA: 09/13/2019  Referring Physician: Carron Curie, MD  HPI: Chad Nash. is a 69 y.o. male seen for follow up of Acute on Chronic Respiratory Failure.  Patient currently is capping on 2L has been doing very well  Medications: Reviewed on Rounds  Physical Exam:  Vitals: Temperature 96.9 pulse 83 respiratory 20 blood pressure is 158/77 saturations 98%  Ventilator Settings capping off the ventilator now  . General: Comfortable at this time . Eyes: Grossly normal lids, irises & conjunctiva . ENT: grossly tongue is normal . Neck: no obvious mass . Cardiovascular: S1 S2 normal no gallop . Respiratory: No rhonchi no rales are noted at this time . Abdomen: soft . Skin: no rash seen on limited exam . Musculoskeletal: not rigid . Psychiatric:unable to assess . Neurologic: no seizure no involuntary movements         Lab Data:   Basic Metabolic Panel: Recent Labs  Lab 09/30/19 0824  NA 143  K 3.8  CL 104  CO2 29  GLUCOSE 116*  BUN 21  CREATININE 0.38*  CALCIUM 9.5  MG 1.9    ABG: No results for input(s): PHART, PCO2ART, PO2ART, HCO3, O2SAT in the last 168 hours.  Liver Function Tests: No results for input(s): AST, ALT, ALKPHOS, BILITOT, PROT, ALBUMIN in the last 168 hours. No results for input(s): LIPASE, AMYLASE in the last 168 hours. No results for input(s): AMMONIA in the last 168 hours.  CBC: Recent Labs  Lab 09/30/19 0824  WBC 11.3*  HGB 11.2*  HCT 36.0*  MCV 94.5  PLT 232    Cardiac Enzymes: No results for input(s): CKTOTAL, CKMB, CKMBINDEX, TROPONINI in the last 168 hours.  BNP (last 3 results) No results for input(s): BNP in the last 8760 hours.  ProBNP (last 3 results) No results for input(s): PROBNP in the last 8760  hours.  Radiological Exams: No results found.  Assessment/Plan Active Problems:   Acute on chronic respiratory failure with hypoxia (HCC)   COVID-19 virus infection   Pneumonia due to COVID-19 virus   Acute respiratory distress syndrome (ARDS) due to 2019 novel coronavirus (HCC)   Severe sepsis (HCC)   Critical illness myopathy   1. Acute on chronic respiratory failure with hypoxia patient is currently capping has been on 2 L oxygen good saturations are noted because of secretions holding off on decannulation will reassess 2. COVID-19 virus infection resolved we will continue with supportive care 3. Pneumonia due to COVID-19 treated we will continue to follow 4. ARDS following we will continue supportive care 5. Severe sepsis resolved 6. Critical illness myopathy physical therapy as tolerated continue present management   I have personally seen and evaluated the patient, evaluated laboratory and imaging results, formulated the assessment and plan and placed orders. The Patient requires high complexity decision making with multiple systems involvement.  Rounds were done with the Respiratory Therapy Director and Staff therapists and discussed with nursing staff also.  [name], MD [facility] Pulmonary Critical Care Medicine Sleep Medicine

## 2019-10-05 DIAGNOSIS — J9621 Acute and chronic respiratory failure with hypoxia: Secondary | ICD-10-CM | POA: Diagnosis not present

## 2019-10-05 DIAGNOSIS — U071 COVID-19: Secondary | ICD-10-CM | POA: Diagnosis not present

## 2019-10-05 DIAGNOSIS — A419 Sepsis, unspecified organism: Secondary | ICD-10-CM | POA: Diagnosis not present

## 2019-10-05 DIAGNOSIS — G7281 Critical illness myopathy: Secondary | ICD-10-CM | POA: Diagnosis not present

## 2019-10-05 NOTE — Progress Notes (Signed)
Pulmonary Critical Care Medicine [facility] [place]   PULMONARY CRITICAL CARE SERVICE  PROGRESS NOTE  Date of Service: 10/05/2019  Chad Nash.  [id number]  DOB: 04/04/1951   DOA: 09/13/2019  Referring Physician: Carron Curie, MD  HPI: Chad Grenz. is a 68 y.o. male seen for follow up of Acute on Chronic Respiratory Failure.  Patient currently is capping should be able to decannulate today has done well now for several days  Medications: Reviewed on Rounds  Physical Exam:  Vitals: Temperature 97.0 pulse 82 respiratory 25 blood pressure is 138/74 saturations 96%  Ventilator Settings capping off the vent  . General: Comfortable at this time . Eyes: Grossly normal lids, irises & conjunctiva . ENT: grossly tongue is normal . Neck: no obvious mass . Cardiovascular: S1 S2 normal no gallop . Respiratory: No rhonchi coarse breath sounds are noted . Abdomen: soft . Skin: no rash seen on limited exam . Musculoskeletal: not rigid . Psychiatric:unable to assess . Neurologic: no seizure no involuntary movements         Lab Data:   Basic Metabolic Panel: Recent Labs  Lab 09/30/19 0824  NA 143  K 3.8  CL 104  CO2 29  GLUCOSE 116*  BUN 21  CREATININE 0.38*  CALCIUM 9.5  MG 1.9    ABG: No results for input(s): PHART, PCO2ART, PO2ART, HCO3, O2SAT in the last 168 hours.  Liver Function Tests: No results for input(s): AST, ALT, ALKPHOS, BILITOT, PROT, ALBUMIN in the last 168 hours. No results for input(s): LIPASE, AMYLASE in the last 168 hours. No results for input(s): AMMONIA in the last 168 hours.  CBC: Recent Labs  Lab 09/30/19 0824  WBC 11.3*  HGB 11.2*  HCT 36.0*  MCV 94.5  PLT 232    Cardiac Enzymes: No results for input(s): CKTOTAL, CKMB, CKMBINDEX, TROPONINI in the last 168 hours.  BNP (last 3 results) No results for input(s): BNP in the last 8760 hours.  ProBNP (last 3 results) No results for input(s): PROBNP in  the last 8760 hours.  Radiological Exams: No results found.  Assessment/Plan Active Problems:   Acute on chronic respiratory failure with hypoxia (HCC)   COVID-19 virus infection   Pneumonia due to COVID-19 virus   Acute respiratory distress syndrome (ARDS) due to 2019 novel coronavirus (HCC)   Severe sepsis (HCC)   Critical illness myopathy   1. Acute on chronic respiratory failure with hypoxia plan is to continue with weaning trials proceed to decannulation now 2. COVID-19 virus infection treated resolved 3. Pneumonia due to COVID-19 resolving we will continue to follow 4. ARDS improved 5. Severe sepsis resolved 6. Critical illness myopathy will need ongoing physical therapy.   I have personally seen and evaluated the patient, evaluated laboratory and imaging results, formulated the assessment and plan and placed orders. The Patient requires high complexity decision making with multiple systems involvement.  Rounds were done with the Respiratory Therapy Director and Staff therapists and discussed with nursing staff also.  [name], MD [facility] Pulmonary Critical Care Medicine Sleep Medicine

## 2019-10-06 DIAGNOSIS — A419 Sepsis, unspecified organism: Secondary | ICD-10-CM | POA: Diagnosis not present

## 2019-10-06 DIAGNOSIS — J9621 Acute and chronic respiratory failure with hypoxia: Secondary | ICD-10-CM | POA: Diagnosis not present

## 2019-10-06 DIAGNOSIS — U071 COVID-19: Secondary | ICD-10-CM | POA: Diagnosis not present

## 2019-10-06 DIAGNOSIS — G7281 Critical illness myopathy: Secondary | ICD-10-CM | POA: Diagnosis not present

## 2019-10-06 LAB — BASIC METABOLIC PANEL
Anion gap: 11 (ref 5–15)
BUN: 19 mg/dL (ref 8–23)
CO2: 30 mmol/L (ref 22–32)
Calcium: 9.5 mg/dL (ref 8.9–10.3)
Chloride: 105 mmol/L (ref 98–111)
Creatinine, Ser: 0.4 mg/dL — ABNORMAL LOW (ref 0.61–1.24)
GFR calc Af Amer: >60 mL/min (ref >60)
GFR calc non Af Amer: >60 mL/min (ref >60)
Glucose, Bld: 128 mg/dL — ABNORMAL HIGH (ref 70–99)
Potassium: 3.4 mmol/L — ABNORMAL LOW (ref 3.5–5.1)
Sodium: 146 mmol/L — ABNORMAL HIGH (ref 135–145)

## 2019-10-06 LAB — CBC
HCT: 34.3 % — ABNORMAL LOW (ref 39.0–52.0)
Hemoglobin: 10.8 g/dL — ABNORMAL LOW (ref 13.0–17.0)
MCH: 29.3 pg (ref 26.0–34.0)
MCHC: 31.5 g/dL (ref 30.0–36.0)
MCV: 93.2 fL (ref 80.0–100.0)
Platelets: 231 10*3/uL (ref 150–400)
RBC: 3.68 MIL/uL — ABNORMAL LOW (ref 4.22–5.81)
RDW: 14.8 % (ref 11.5–15.5)
WBC: 7 10*3/uL (ref 4.0–10.5)
nRBC: 0 % (ref 0.0–0.2)

## 2019-10-06 NOTE — Progress Notes (Signed)
Pulmonary Critical Care Medicine [facility] [place]   PULMONARY CRITICAL CARE SERVICE  PROGRESS NOTE  Date of Service: 10/06/2019  Nash Chad.  LZJ:[id number]  DOB: 1951-03-10   DOA: 09/13/2019  Referring Physician: Carron Curie, MD  HPI: Chad Nash. is a 69 y.o. male seen for follow up of Acute on Chronic Respiratory Failure.  Patient currently is on 2 L oxygen decannulated doing well  Medications: Reviewed on Rounds  Physical Exam:  Vitals: Temperature 97.0 pulse 83 respiratory 22 blood pressure is 133/76 saturations 95%  Ventilator Settings on 2 L  . General: Comfortable at this time . Eyes: Grossly normal lids, irises & conjunctiva . ENT: grossly tongue is normal . Neck: no obvious mass . Cardiovascular: S1 S2 normal no gallop . Respiratory: No rhonchi no rales . Abdomen: soft . Skin: no rash seen on limited exam . Musculoskeletal: not rigid . Psychiatric:unable to assess . Neurologic: no seizure no involuntary movements         Lab Data:   Basic Metabolic Panel: Recent Labs  Lab 09/30/19 0824 10/06/19 0559  NA 143 146*  K 3.8 3.4*  CL 104 105  CO2 29 30  GLUCOSE 116* 128*  BUN 21 19  CREATININE 0.38* 0.40*  CALCIUM 9.5 9.5  MG 1.9  --     ABG: No results for input(s): PHART, PCO2ART, PO2ART, HCO3, O2SAT in the last 168 hours.  Liver Function Tests: No results for input(s): AST, ALT, ALKPHOS, BILITOT, PROT, ALBUMIN in the last 168 hours. No results for input(s): LIPASE, AMYLASE in the last 168 hours. No results for input(s): AMMONIA in the last 168 hours.  CBC: Recent Labs  Lab 09/30/19 0824 10/06/19 0559  WBC 11.3* 7.0  HGB 11.2* 10.8*  HCT 36.0* 34.3*  MCV 94.5 93.2  PLT 232 231    Cardiac Enzymes: No results for input(s): CKTOTAL, CKMB, CKMBINDEX, TROPONINI in the last 168 hours.  BNP (last 3 results) No results for input(s): BNP in the last 8760 hours.  ProBNP (last 3 results) No results for  input(s): PROBNP in the last 8760 hours.  Radiological Exams: No results found.  Assessment/Plan Active Problems:   Acute on chronic respiratory failure with hypoxia (HCC)   COVID-19 virus infection   Pneumonia due to COVID-19 virus   Acute respiratory distress syndrome (ARDS) due to 2019 novel coronavirus (HCC)   Severe sepsis (HCC)   Critical illness myopathy   1. Acute on chronic respiratory failure hypoxia plan is to continue with oxygen therapy as ordered.  We will continue to monitor over the weekend 2. COVID-19 virus infection in resolution phase 3. Pneumonia due to COVID-19 treated improving 4. ARDS improving 5. Severe sepsis resolved 6. Critical illness myopathy physical therapy as tolerated   I have personally seen and evaluated the patient, evaluated laboratory and imaging results, formulated the assessment and plan and placed orders. The Patient requires high complexity decision making with multiple systems involvement.  Rounds were done with the Respiratory Therapy Director and Staff therapists and discussed with nursing staff also.  [name], MD [facility] Pulmonary Critical Care Medicine Sleep Medicine

## 2019-10-07 LAB — POTASSIUM: Potassium: 3.4 mmol/L — ABNORMAL LOW (ref 3.5–5.1)

## 2019-10-08 ENCOUNTER — Other Ambulatory Visit (HOSPITAL_COMMUNITY): Payer: Medicare Other

## 2019-10-08 DIAGNOSIS — U071 COVID-19: Secondary | ICD-10-CM | POA: Diagnosis not present

## 2019-10-08 DIAGNOSIS — J9621 Acute and chronic respiratory failure with hypoxia: Secondary | ICD-10-CM | POA: Diagnosis not present

## 2019-10-08 DIAGNOSIS — A419 Sepsis, unspecified organism: Secondary | ICD-10-CM | POA: Diagnosis not present

## 2019-10-08 DIAGNOSIS — G7281 Critical illness myopathy: Secondary | ICD-10-CM | POA: Diagnosis not present

## 2019-10-08 LAB — CULTURE, RESPIRATORY W GRAM STAIN

## 2019-10-08 LAB — POTASSIUM: Potassium: 3.6 mmol/L (ref 3.5–5.1)

## 2019-10-08 MED ORDER — GENERIC EXTERNAL MEDICATION
Status: DC
Start: ? — End: 2019-10-08

## 2019-10-08 NOTE — Progress Notes (Signed)
Pulmonary Critical Care Medicine [facility] [place]   PULMONARY CRITICAL CARE SERVICE  PROGRESS NOTE  Date of Service: 10/08/2019  Chad Nash.  [id number]:[id number]  DOB: [birth year]   DOA: 09/13/2019  Referring Physician: Carron Curie, MD  HPI: Chad Nash. is a 69 y.o. male seen for follow up of Acute on Chronic Respiratory Failure.  Looks good decannulated on 2 L no issues with the stoma.  Patient has good saturations noted  Medications: Reviewed on Rounds  Physical Exam:  Vitals: Temperature 96.7 pulse 84 respiratory 26 blood pressure is 136/75 saturations 99%  Ventilator Settings decannulated on 2 L  . General: Comfortable at this time . Eyes: Grossly normal lids, irises & conjunctiva . ENT: grossly tongue is normal . Neck: no obvious mass . Cardiovascular: S1 S2 normal no gallop . Respiratory: No rhonchi no rales are noted at this time . Abdomen: soft . Skin: no rash seen on limited exam . Musculoskeletal: not rigid . Psychiatric:unable to assess . Neurologic: no seizure no involuntary movements         Lab Data:   Basic Metabolic Panel: Recent Labs  Lab 10/06/19 0559 10/07/19 0635 10/08/19 0521  NA 146*  --   --   K 3.4* 3.4* 3.6  CL 105  --   --   CO2 30  --   --   GLUCOSE 128*  --   --   BUN 19  --   --   CREATININE 0.40*  --   --   CALCIUM 9.5  --   --     ABG: No results for input(s): PHART, PCO2ART, PO2ART, HCO3, O2SAT in the last 168 hours.  Liver Function Tests: No results for input(s): AST, ALT, ALKPHOS, BILITOT, PROT, ALBUMIN in the last 168 hours. No results for input(s): LIPASE, AMYLASE in the last 168 hours. No results for input(s): AMMONIA in the last 168 hours.  CBC: Recent Labs  Lab 10/06/19 0559  WBC 7.0  HGB 10.8*  HCT 34.3*  MCV 93.2  PLT 231    Cardiac Enzymes: No results for input(s): CKTOTAL, CKMB, CKMBINDEX, TROPONINI in the last 168 hours.  BNP (last 3 results) No results for  input(s): BNP in the last 8760 hours.  ProBNP (last 3 results) No results for input(s): PROBNP in the last 8760 hours.  Radiological Exams: No results found.  Assessment/Plan Active Problems:   Acute on chronic respiratory failure with hypoxia (HCC)   COVID-19 virus infection   Pneumonia due to COVID-19 virus   Acute respiratory distress syndrome (ARDS) due to 2019 novel coronavirus (HCC)   Severe sepsis (HCC)   Critical illness myopathy   1. Acute on chronic respiratory failure with hypoxia plan continue with oxygen therapy titrate as tolerated continue secretion management supportive care 2. COVID-19 virus infection resolved 3. Pneumonia due to COVID-19 clinically is improving 4. ARDS also improving we will continue to follow 5. Severe sepsis resolved hemodynamics are stable 6. Critical illness myopathy no changes are noted at this time   I have personally seen and evaluated the patient, evaluated laboratory and imaging results, formulated the assessment and plan and placed orders. The Patient requires high complexity decision making with multiple systems involvement.  Rounds were done with the Respiratory Therapy Director and Staff therapists and discussed with nursing staff also.  [name], MD [facility] Pulmonary Critical Care Medicine Sleep Medicine

## 2019-10-10 LAB — NOVEL CORONAVIRUS, NAA (HOSP ORDER, SEND-OUT TO REF LAB; TAT 18-24 HRS): SARS-CoV-2, NAA: NOT DETECTED

## 2020-07-31 IMAGING — DX DG ABDOMEN 1V
1 series · 1 of 1 positions shown · non-contrast
Comparison: None.

CLINICAL DATA: NG tube placement

EXAM:
ABDOMEN - 1 VIEW

[abdomen kub]
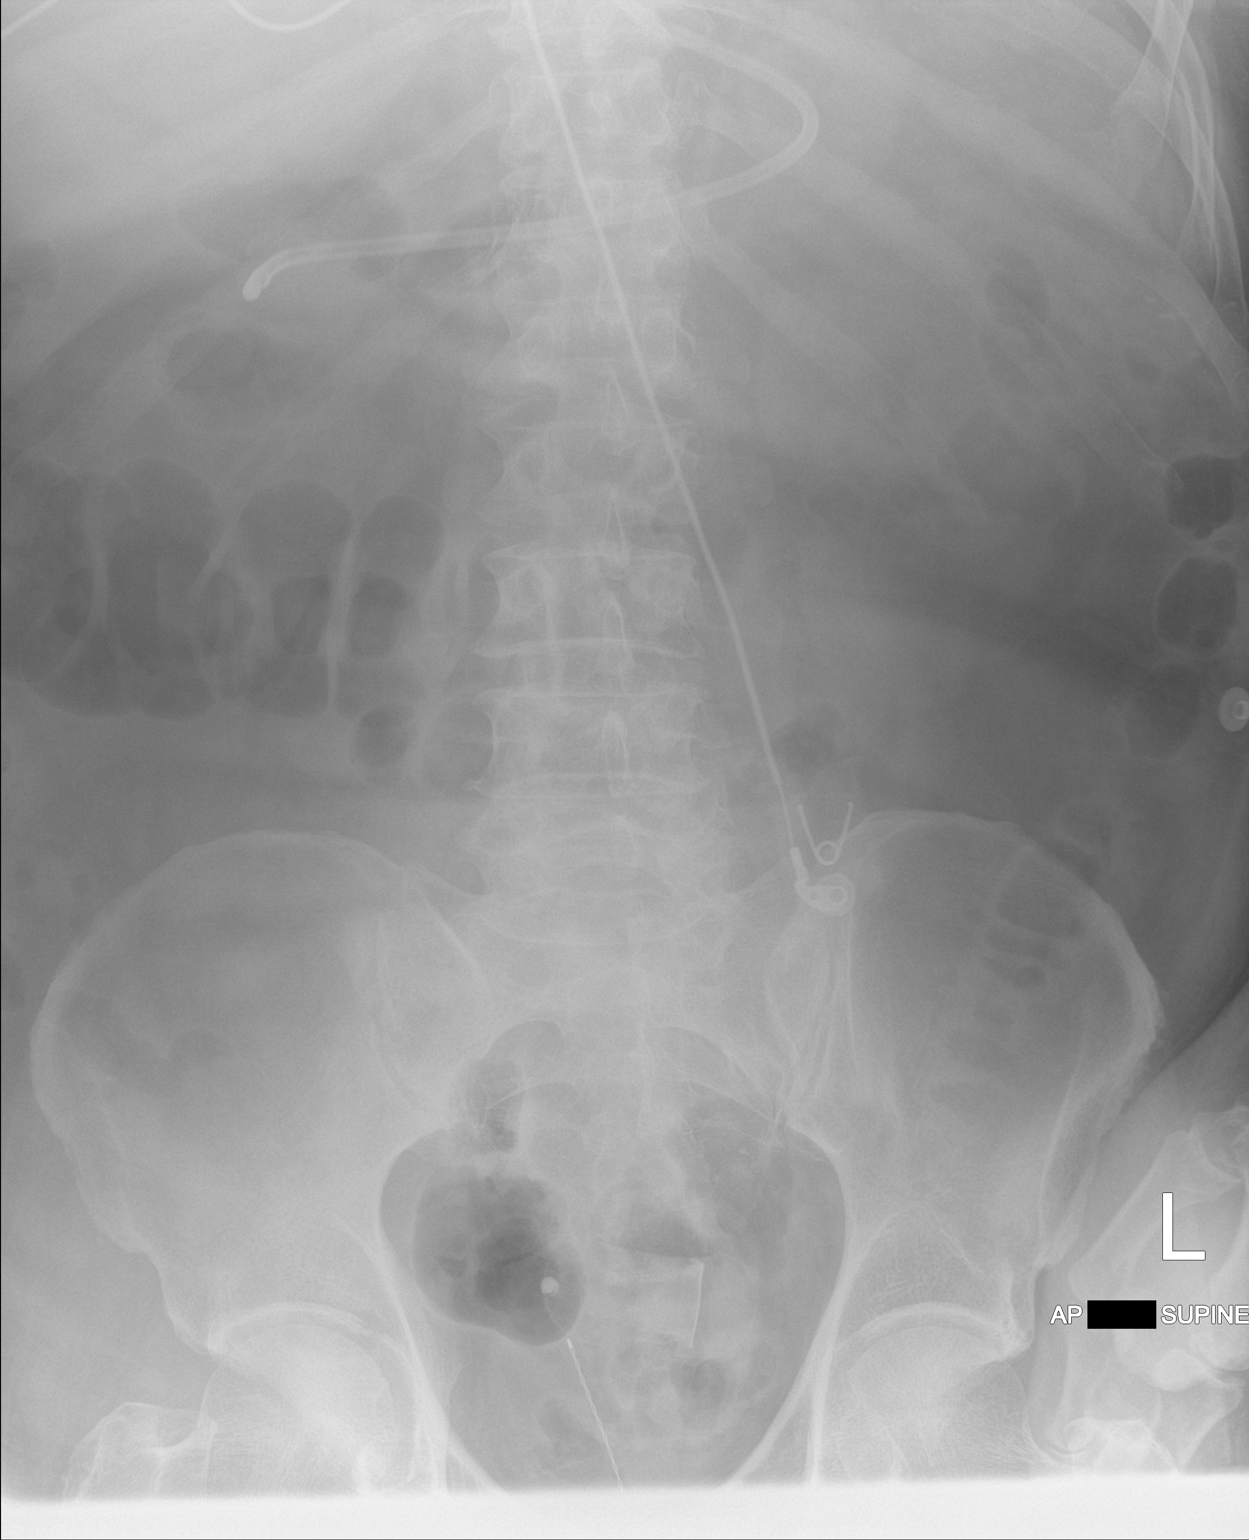

[1 of 1 positions shown; findings below may reference images not displayed]

FINDINGS: The enteric tube projects over the gastric antrum/proximal duodenum.
The bowel gas pattern is nonobstructive and nonspecific.
Degenerative changes are noted in the lumbar spine.
IMPRESSION: Enteric tube as above.

## 2020-08-13 IMAGING — DX DG CHEST 1V PORT
1 series · 1 of 1 positions shown · non-contrast
Comparison: September 22, 2019.

CLINICAL DATA: Respiratory failure.

EXAM:
PORTABLE CHEST 1 VIEW

[chest ap]
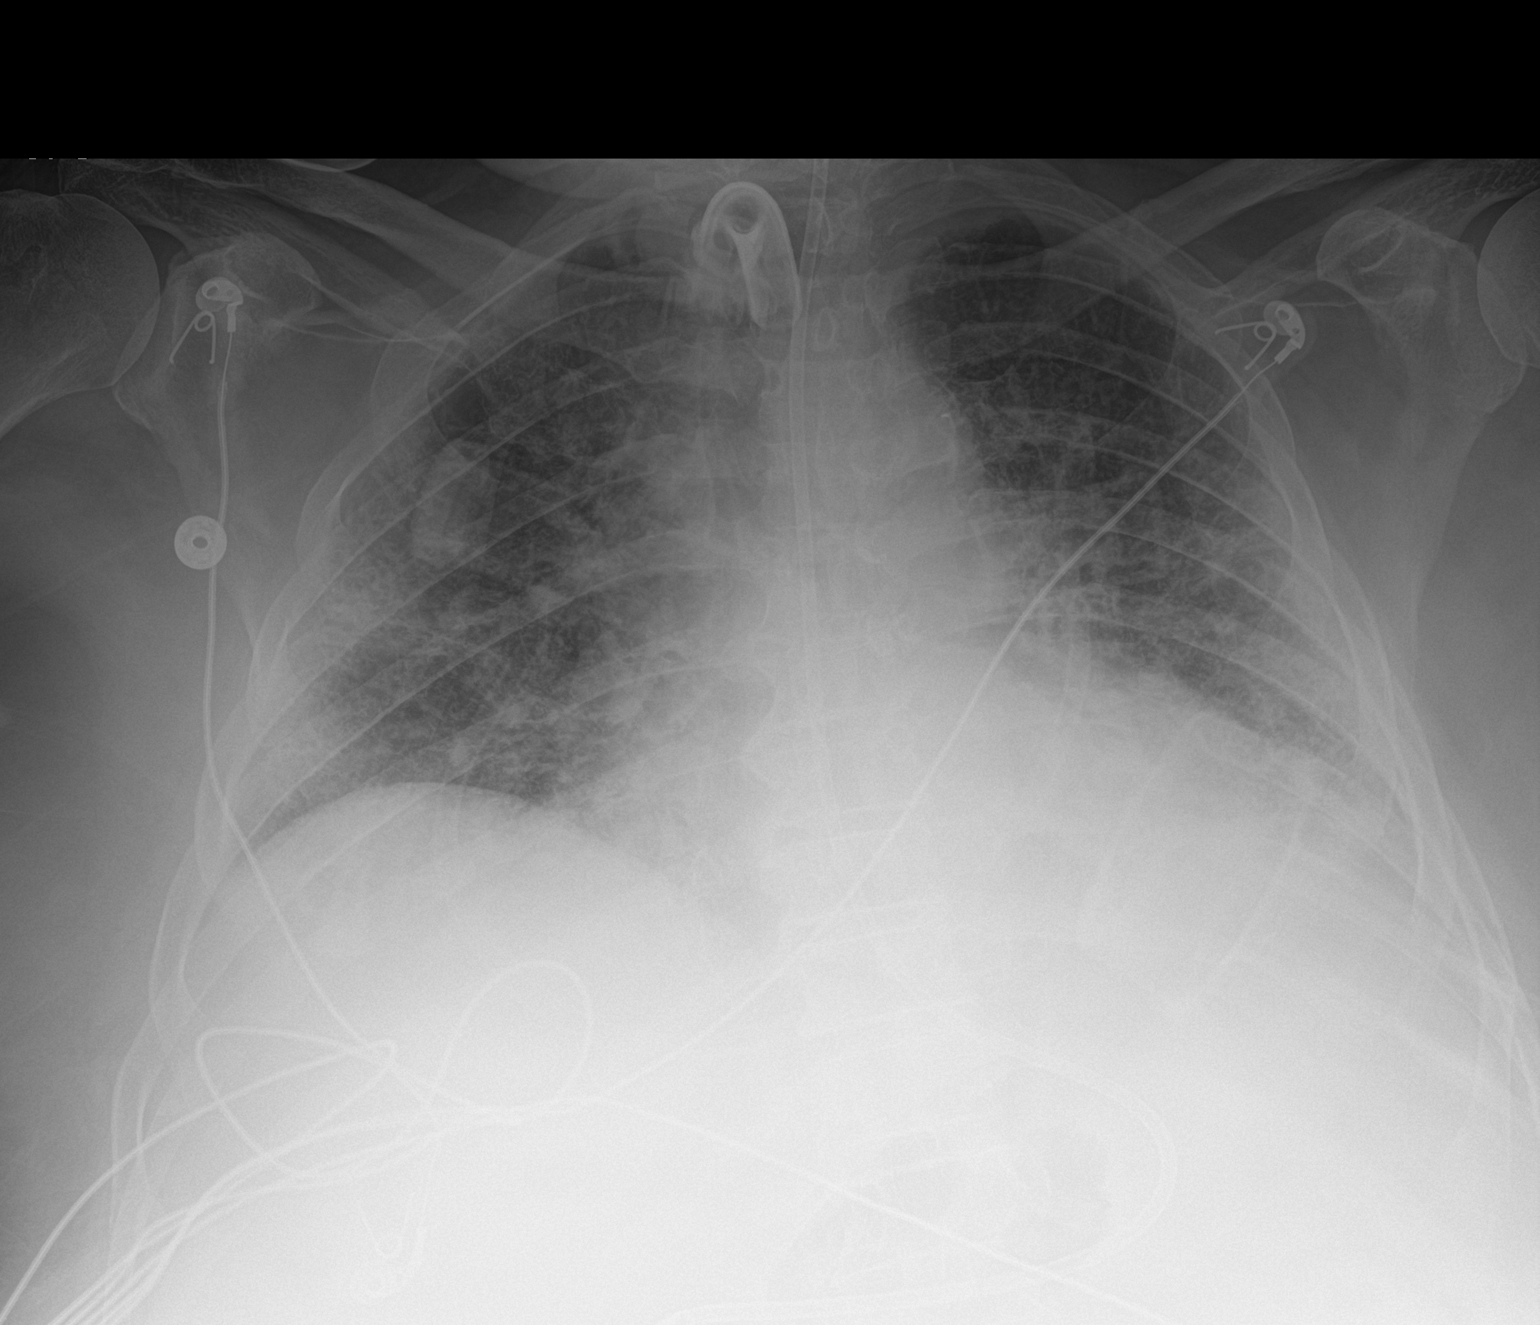

[1 of 1 positions shown; findings below may reference images not displayed]

FINDINGS: Stable cardiomegaly. Tracheostomy and feeding tubes are unchanged in
position. No pneumothorax is noted. Stable bilateral diffuse
interstitial and airspace opacities are noted concerning for
multifocal pneumonia. Small left pleural effusion cannot be
excluded. Bony thorax is unremarkable.
IMPRESSION: Stable support apparatus. Stable bilateral diffuse interstitial and
airspace opacities are noted concerning for multifocal pneumonia.
Small left pleural effusion cannot be excluded.

## 2020-08-15 IMAGING — DX DG CHEST 1V PORT
1 series · 1 of 1 positions shown · non-contrast
Comparison: Chest x-ray 09/26/2019.

CLINICAL DATA: 68-year-old male status post nasogastric tube
placement.

EXAM:
PORTABLE CHEST 1 VIEW

[chest]
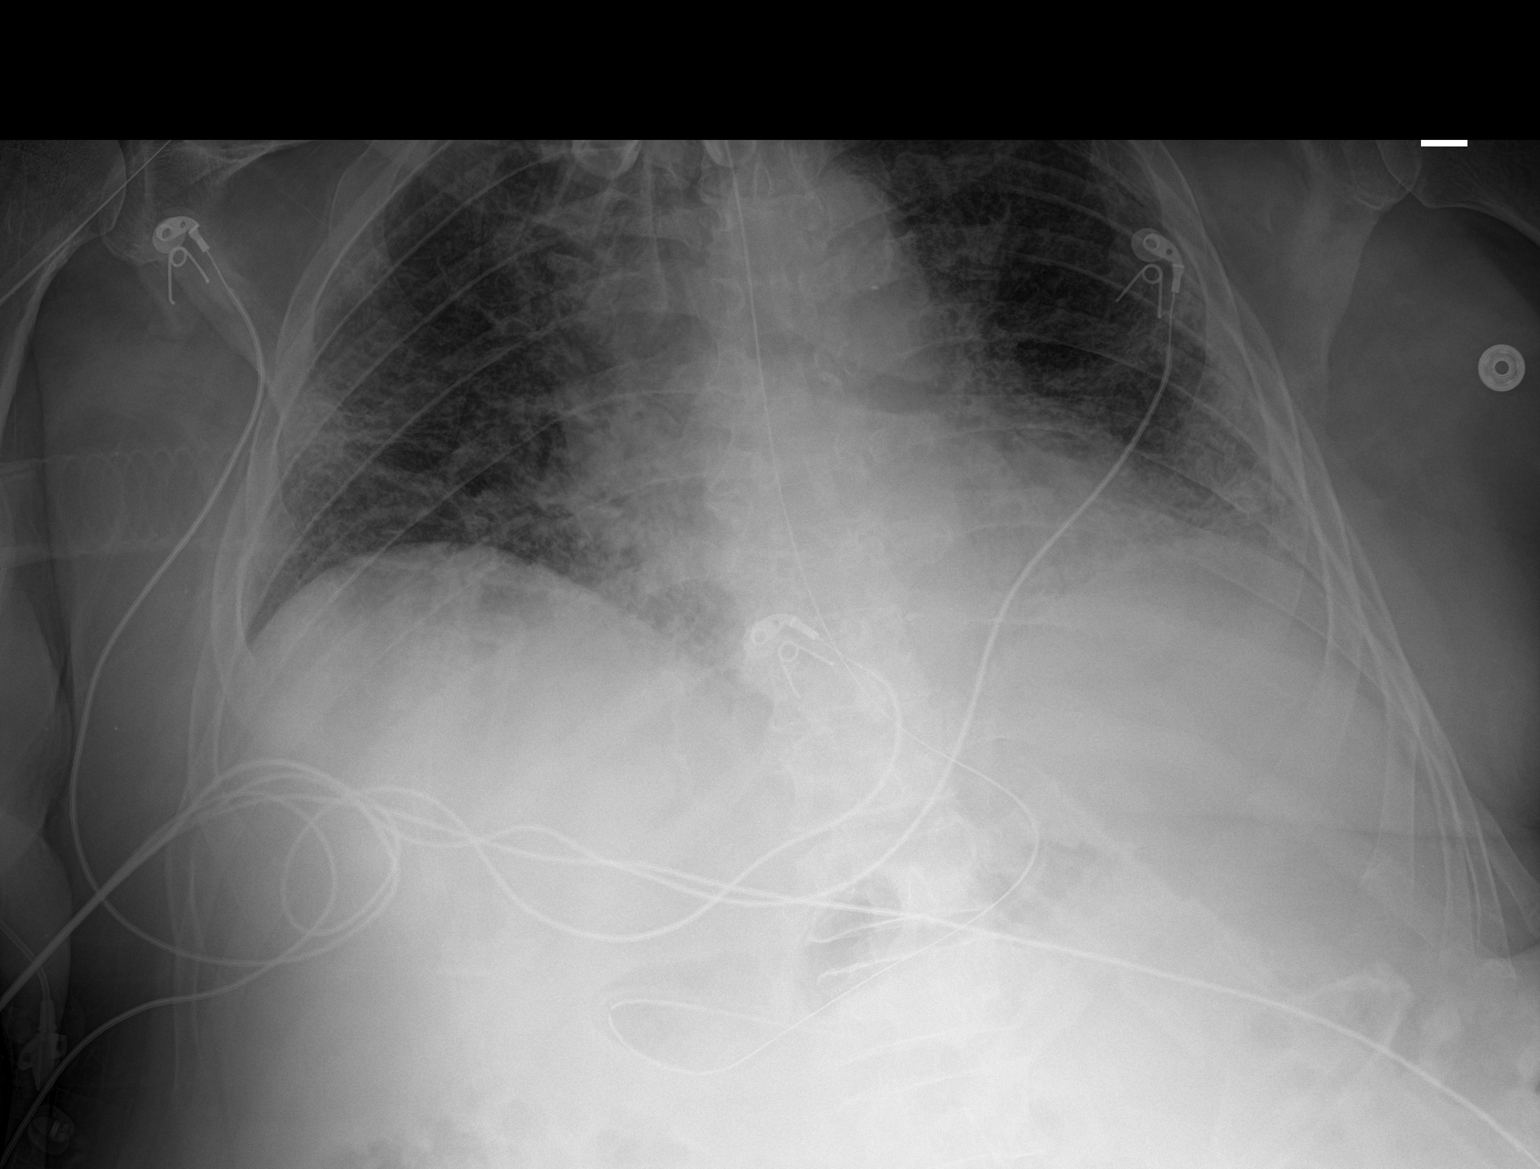

[1 of 1 positions shown; findings below may reference images not displayed]

FINDINGS: A tracheostomy tube is in place with tip 4.9 cm above the carina.
Nasogastric tube with tip coiled in the antral pre-pyloric region of
the stomach. Lung volumes are low. Patchy multifocal ill-defined
opacities and areas of extensive interstitial prominence most
evident throughout the periphery of the mid to lower lungs
bilaterally, similar to the prior study. No pleural effusions. No
evidence of pulmonary edema. Heart size appears borderline to mildly
enlarged. The patient is rotated to the right on today's exam,
resulting in distortion of the mediastinal contours and reduced
diagnostic sensitivity and specificity for mediastinal pathology.
Aortic atherosclerosis.
IMPRESSION: 1. Support apparatus as above.
2. Low lung volumes with persistent multilobar bilateral pneumonia.
Overall, aeration appears unchanged compared to the prior study.
3. Aortic atherosclerosis.
